# Patient Record
Sex: Female | Born: 2000 | Race: White | Hispanic: No | Marital: Single | Smoking: Former smoker
Health system: Southern US, Community
[De-identification: ages and names within clinical notes are randomized; demographics above are authoritative.]

## PROBLEM LIST (undated history)

## (undated) DIAGNOSIS — F419 Anxiety disorder, unspecified: Secondary | ICD-10-CM

## (undated) DIAGNOSIS — F909 Attention-deficit hyperactivity disorder, unspecified type: Secondary | ICD-10-CM

## (undated) DIAGNOSIS — F1310 Sedative, hypnotic or anxiolytic abuse, uncomplicated: Secondary | ICD-10-CM

## (undated) DIAGNOSIS — N1370 Vesicoureteral-reflux, unspecified: Secondary | ICD-10-CM

## (undated) HISTORY — DX: Vesicoureteral-reflux, unspecified: N13.70

## (undated) HISTORY — DX: Anxiety disorder, unspecified: F41.9

## (undated) HISTORY — DX: Attention-deficit hyperactivity disorder, unspecified type: F90.9

## (undated) HISTORY — PX: OTHER SURGICAL HISTORY: SHX169

## (undated) HISTORY — PX: SKIN GRAFT: SHX250

---
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
## 2018-02-11 ENCOUNTER — Ambulatory Visit: Payer: Self-pay | Admitting: Family Medicine

## 2018-03-08 ENCOUNTER — Other Ambulatory Visit: Payer: Self-pay

## 2018-03-08 ENCOUNTER — Encounter: Payer: Self-pay | Admitting: Family Medicine

## 2018-03-08 ENCOUNTER — Emergency Department
Admission: EM | Admit: 2018-03-08 | Discharge: 2018-03-08 | Disposition: A | Discharge status: Home / Self Care | Payer: Self-pay | Attending: Emergency Medicine | Admitting: Emergency Medicine

## 2018-03-08 DIAGNOSIS — F17200 Nicotine dependence, unspecified, uncomplicated: Secondary | ICD-10-CM | POA: Insufficient documentation

## 2018-03-08 DIAGNOSIS — F909 Attention-deficit hyperactivity disorder, unspecified type: Secondary | ICD-10-CM | POA: Insufficient documentation

## 2018-03-08 DIAGNOSIS — F1010 Alcohol abuse, uncomplicated: Secondary | ICD-10-CM | POA: Insufficient documentation

## 2018-03-08 DIAGNOSIS — F1310 Sedative, hypnotic or anxiolytic abuse, uncomplicated: Secondary | ICD-10-CM | POA: Insufficient documentation

## 2018-03-08 HISTORY — DX: Sedative, hypnotic or anxiolytic abuse, uncomplicated: F13.10

## 2018-03-08 LAB — POCT PREGNANCY, URINE: Preg Test, Ur: NEGATIVE

## 2018-03-08 LAB — URINE DRUG SCREEN, QUALITATIVE (ARMC ONLY)
Amphetamines, Ur Screen: NOT DETECTED
BARBITURATES, UR SCREEN: NOT DETECTED
Benzodiazepine, Ur Scrn: POSITIVE — AB
CANNABINOID 50 NG, UR ~~LOC~~: POSITIVE — AB
COCAINE METABOLITE, UR ~~LOC~~: NOT DETECTED
MDMA (Ecstasy)Ur Screen: NOT DETECTED
Methadone Scn, Ur: NOT DETECTED
Opiate, Ur Screen: NOT DETECTED
PHENCYCLIDINE (PCP) UR S: NOT DETECTED
TRICYCLIC, UR SCREEN: NOT DETECTED

## 2018-03-08 LAB — COMPREHENSIVE METABOLIC PANEL
ALT: 17 U/L (ref 0–44)
AST: 27 U/L (ref 15–41)
Albumin: 4.6 g/dL (ref 3.5–5.0)
Alkaline Phosphatase: 38 U/L (ref 38–126)
Anion gap: 8 (ref 5–15)
BILIRUBIN TOTAL: 0.4 mg/dL (ref 0.3–1.2)
BUN: 21 mg/dL — AB (ref 6–20)
CO2: 24 mmol/L (ref 22–32)
CREATININE: 0.78 mg/dL (ref 0.44–1.00)
Calcium: 9.4 mg/dL (ref 8.9–10.3)
Chloride: 104 mmol/L (ref 98–111)
GFR calc Af Amer: >60 mL/min (ref >60)
GLUCOSE: 108 mg/dL — AB (ref 70–99)
Potassium: 3.6 mmol/L (ref 3.5–5.1)
Sodium: 136 mmol/L (ref 135–145)
TOTAL PROTEIN: 7.9 g/dL (ref 6.5–8.1)

## 2018-03-08 LAB — CBC
HCT: 40.5 % (ref 36.0–46.0)
HEMOGLOBIN: 13.2 g/dL (ref 12.0–15.0)
MCH: 29.9 pg (ref 26.0–34.0)
MCHC: 32.6 g/dL (ref 30.0–36.0)
MCV: 91.6 fL (ref 80.0–100.0)
Platelets: 212 10*3/uL (ref 150–400)
RBC: 4.42 MIL/uL (ref 3.87–5.11)
RDW: 13.4 % (ref 11.5–15.5)
WBC: 10 10*3/uL (ref 4.0–10.5)
nRBC: 0 % (ref 0.0–0.2)

## 2018-03-08 LAB — ETHANOL: Alcohol, Ethyl (B): <10 mg/dL (ref <10)

## 2018-03-08 NOTE — ED Notes (Signed)
Pt states she wants help getting into a rehab program.  Pt reports she has been taking Xanax. Denies other drug use.  Admits to occasional alcohol use. Denies SI/HI and AVH. Denies pain.   Maintained on 15 minute checks and observation by security camera for safety.

## 2018-03-08 NOTE — ED Provider Notes (Signed)
[facility] Emergency Department Provider Note       Time seen: ----------------------------------------- 3:20 PM on 03/08/2018 -----------------------------------------   I have reviewed the triage vital signs and the nursing notes.  HISTORY   Chief Complaint Drug Problem   HPI Jamie Dodson is a 17 y.o. female with a history of anxiety who presents to the ED for benzodiazepine abuse.  Patient reports she binges on benzos, took a Xanax.  Last use was last night.  She was not trying to harm herself.  She admits to drinking sometimes.  She denies any medical complaints.  Past Medical History:  Diagnosis Date  . ADHD (attention deficit hyperactivity disorder)   . Ureteral reflux     Patient Active Problem List   Diagnosis Date Noted  . 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 Fever blister 06/20/2015  . Encounter for counseling regarding contraception 06/20/2015  . Need for HPV vaccine 06/20/2015  . Panic disorder with agoraphobia 06/20/2015    Past Surgical History:  Procedure Laterality Date  . kidney surgery    . SKIN GRAFT     Hand    Allergies Patient has no known allergies.  Social History Social History   Tobacco Use  . Smoking status: Current Some Day Smoker  . Smokeless tobacco: Never Used  Substance Use Topics  . Alcohol use: Yes    Alcohol/week: 0.0 standard drinks    Comment: reports drank 1/3 of 1/5Captain Morgan last night  . Drug use: Yes   Review of Systems Constitutional: Negative for fever. Cardiovascular: Negative for chest pain. Respiratory: Negative for shortness of breath. Gastrointestinal: Negative for abdominal pain, vomiting and diarrhea. Musculoskeletal: Negative for back pain. Skin: Negative for rash. Neurological: Negative for headaches, focal weakness or numbness. Psychiatric: Positive for substance abuse, negative for suicidal or homicidal ideation  All systems negative/normal/unremarkable  except as stated in the HPI  ____________________________________________   PHYSICAL EXAM:  VITAL SIGNS: ED Triage Vitals  Enc Vitals Group     BP 03/08/18 1430 118/80     Pulse Rate 03/08/18 1429 85     Resp 03/08/18 1429 18     Temp 03/08/18 1429 (!) 97.5 F (36.4 C)     Temp Source 03/08/18 1429 Oral     SpO2 03/08/18 1429 100 %     Weight 03/08/18 1430 115 lb (52.2 kg)     Height 03/08/18 1430 5\' 5"  (1.651 m)     Head Circumference --      Peak Flow --      Pain Score 03/08/18 1430 0     Pain Loc --      Pain Edu? --      Excl. in GC? --    Constitutional: Alert and oriented. Well appearing and in no distress. Eyes: Conjunctivae are normal. Normal extraocular movements. Cardiovascular: Normal rate, regular rhythm. No murmurs, rubs, or gallops. Respiratory: Normal respiratory effort without tachypnea nor retractions. Breath sounds are clear and equal bilaterally. No wheezes/rales/rhonchi. Gastrointestinal: Soft and nontender. Normal bowel sounds Musculoskeletal: Nontender with normal range of motion in extremities. No lower extremity tenderness nor edema. Neurologic:  Normal speech and language. No gross focal neurologic deficits are appreciated.  Skin:  Skin is warm, dry and intact. No rash noted. Psychiatric: Mood and affect are normal. Speech and behavior are normal.  ____________________________________________  ED COURSE:  As part of my medical decision making, I reviewed the following data within the electronic MEDICAL RECORD NUMBER History obtained from  family if available, nursing notes, old chart and ekg, as well as notes from prior ED visits. Patient presented for benzodiazepine abuse, we will assess with labs and discussed with TTS concerning outpatient detox   Procedures ____________________________________________   LABS (pertinent positives/negatives)  Labs Reviewed  COMPREHENSIVE METABOLIC PANEL - Abnormal; Notable for the following components:       Result Value   Glucose, Bld 108 (*)    BUN 21 (*)    All other components within normal limits  URINE DRUG SCREEN, QUALITATIVE ([facility] ONLY) - Abnormal; Notable for the following components:   Cannabinoid 50 Ng, Ur [place] POSITIVE (*)    Benzodiazepine, Ur Scrn POSITIVE (*)    All other components within normal limits  ETHANOL  CBC  POCT PREGNANCY, URINE  POC URINE PREG, ED   ___________________________________________  DIFFERENTIAL DIAGNOSIS   Substance use disorder, anxiety  FINAL ASSESSMENT AND PLAN  Benzodiazepine abuse   Plan: The patient had presented for detox referral. Patient's labs are unremarkable.  She appears medically cleared for outpatient detox referral.   Ulice DashJohnathan E Williams, MD   Note: This note was generated in part or whole with voice recognition software. Voice recognition is usually quite accurate but there are transcription errors that can and very often do occur. I apologize for any typographical errors that were not detected and corrected.     Emily FilbertWilliams, Jonathan E, MD 03/08/18 [phone]

## 2018-03-08 NOTE — ED Notes (Signed)
Pt discharged home (with father).  VS stable.  Discharge paperwork reviewed with patient. Pt signed for discharge. Pt denies SI/HI.

## 2018-03-08 NOTE — BH Assessment (Signed)
Assessment Note  Jamie Dodson is a 17 y.o. female. Presented to [facility] ED with history of Xannax abuse. Pt. Shares she took 2-2mg  Xannax bars, and became disoriented. Pt. Reports she binges on benzo's., particularly Xannax bars (2mg ) which she obtains without a prescription from friends or wherever she can obtain them. Pt. Reports she feels depressed and anxious because she has nothing to do. "When I wake up, I just look out the window, theirs nothing to do".  Pt. Reports she drinks alcohol, but only drinks a couple of shots. "I only drink a couple of shots of Rum at a time, I don't really drink". Pt. Denies any medical complaints. Patient denies and SI/HI. Patient denies any AV/H. Pt. Is currently unemployed, and recently lost her job a couple of months ago. Pt. Is currently not in school. Pt. Reports "I finished the 11th grade and am going back to get my GED". Pt. was accompanied to [facility] ED with both her father and mother. Spoke with father, after obtaining permission from pt. Father states "I have seen changes over the past few months with the friends she brings home". Both father and mother want daughter to go to substance abuse classes and get some help. Pt is alert to time, date, situation, and place.  Pt. Request information for outpatient classes for substance abuse treatment. This Clinical research associatewriter provided  pt.with information for [facility] and [facility] on when Pt. Can go to clinic for assessment and attend classes on Substance Abuse Disorder. Pt. Is ambulatory and will be discharged with both parents.  Diagnosis: Panic Disorder, Anxiety Disorder   Past Medical History:  Past Medical History:  Diagnosis Date  . ADHD (attention deficit hyperactivity disorder)   . Ureteral reflux     Past Surgical History:  Procedure Laterality Date  . kidney surgery    . SKIN GRAFT     Hand    Family History:  Family History  Problem Relation Age of Onset  . Diabetes Maternal Grandmother   .  Hypertension Maternal Grandmother     Social History:  reports that she has been smoking. She has never used smokeless tobacco. She reports current alcohol use. She reports current drug use.  Additional Social History:  Alcohol / Drug Use Pain Medications: SEE MAR Prescriptions: SEE MAR Over the Counter: SEE MAR History of alcohol / drug use?: Yes Longest period of sobriety (when/how long): Unknown Negative Consequences of Use: Personal relationships Withdrawal Symptoms: Agitation Substance #1 Name of Substance 1: Xanax 1 - Age of First Use: 16 1 - Amount (size/oz): 2 mg 1 - Frequency: unknown 1 - Duration: unknown 1 - Last Use / Amount: 03/07/2018 Substance #2 Name of Substance 2: Alocohol 2 - Age of First Use: 16 2 - Amount (size/oz): "couple od shots of Rum" 2 - Frequency: periodically 2 - Duration: unknown 2 - Last Use / Amount: unknown  CIWA: CIWA-Ar BP: 115/74 Pulse Rate: 92 COWS:    Allergies: No Known Allergies  Home Medications: (Not in a hospital admission)   OB/GYN Status:  Patient's last menstrual period was 02/22/2018.  General Assessment Data Location of Assessment: [facility] ED Is this a Tele or Face-to-Face Assessment?: Face-to-Face Is this an Initial Assessment or a Re-assessment for this encounter?: Initial Assessment Patient Accompanied by:: Parent Language Other than English: No Living Arrangements: Other (Comment) What gender do you identify as?: Female Marital status: Single Maiden name: NA Pregnancy Status: No Living Arrangements: Parent Can pt return to current  living arrangement?: Yes Admission Status: Voluntary Is patient capable of signing voluntary admission?: Yes Referral Source: Self/Family/Friend Insurance type: none  Medical Screening Exam [facility] Walk-in ONLY) Medical Exam completed: Yes  Crisis Care Plan Living Arrangements: Parent Legal Guardian: Father Name of Psychiatrist: NA Name of Therapist: NA  Education Status Is  patient currently in school?: No Is the patient employed, unemployed or receiving disability?: Employed  Risk to self with the past 6 months Suicidal Ideation: No Has patient been a risk to self within the past 6 months prior to admission? : No Suicidal Intent: No Has patient had any suicidal intent within the past 6 months prior to admission? : No Is patient at risk for suicide?: No Suicidal Plan?: No Has patient had any suicidal plan within the past 6 months prior to admission? : No Access to Means: No What has been your use of drugs/alcohol within the last 12 months?: Xanax 2mg  Bars Previous Attempts/Gestures: No How many times?: 0 Other Self Harm Risks: None Triggers for Past Attempts: None known Intentional Self Injurious Behavior: None Family Suicide History: No Recent stressful life event(s): Job Loss Persecutory voices/beliefs?: No Depression: Yes Depression Symptoms: Insomnia, Loss of interest in usual pleasures, Feeling worthless/self pity Substance abuse history and/or treatment for substance abuse?: No Suicide prevention information given to non-admitted patients: Yes  Risk to Others within the past 6 months Homicidal Ideation: No Does patient have any lifetime risk of violence toward others beyond the six months prior to admission? : No Thoughts of Harm to Others: No Current Homicidal Plan: No Access to Homicidal Means: No Identified Victim: NA History of harm to others?: No Assessment of Violence: None Noted Violent Behavior Description: None Does patient have access to weapons?: No Criminal Charges Pending?: No Does patient have a court date: No Is patient on probation?: No  Psychosis Hallucinations: None noted Delusions: None noted  Mental Status Report Appearance/Hygiene: Unable to Assess Eye Contact: Good Motor Activity: Freedom of movement Speech: Logical/coherent, Soft Level of Consciousness: Alert Mood: Depressed, Worthless, low  self-esteem Affect: Depressed, Sad Anxiety Level: Moderate Thought Processes: Coherent, Circumstantial Judgement: Unimpaired Orientation: Person, Place, Time, Situation Obsessive Compulsive Thoughts/Behaviors: None  Cognitive Functioning Concentration: Good Memory: Recent Intact Is patient IDD: No Insight: Good Impulse Control: Good Appetite: Good Have you had any weight changes? : No Change Sleep: Decreased Total Hours of Sleep: 5 Vegetative Symptoms: None  ADLScreening [facility] Assessment Services) Patient's cognitive ability adequate to safely complete daily activities?: Yes Patient able to express need for assistance with ADLs?: Yes Independently performs ADLs?: Yes (appropriate for developmental age)  Prior Inpatient Therapy Prior Inpatient Therapy: No Prior Therapy Dates: NA Prior Therapy Facilty/Provider(s): NA Reason for Treatment: NA  Prior Outpatient Therapy Prior Outpatient Therapy: No Does patient have an ACCT team?: No Does patient have Intensive In-House Services?  : No Does patient have [facility] services? : No Does patient have P4CC services?: No  ADL Screening (condition at time of admission) Patient's cognitive ability adequate to safely complete daily activities?: Yes Is the patient deaf or have difficulty hearing?: No Does the patient have difficulty seeing, even when wearing glasses/contacts?: No Does the patient have difficulty concentrating, remembering, or making decisions?: No Patient able to express need for assistance with ADLs?: Yes Does the patient have difficulty dressing or bathing?: No Independently performs ADLs?: Yes (appropriate for developmental age) Does the patient have difficulty walking or climbing stairs?: No Weakness of Legs: None Weakness of Arms/Hands: None  Home Assistive Devices/Equipment Home  Assistive Devices/Equipment: None  Therapy Consults (therapy consults require a physician order) PT Evaluation Needed: No OT  Evalulation Needed: No SLP Evaluation Needed: No Abuse/Neglect Assessment (Assessment to be complete while patient is alone) Abuse/Neglect Assessment Can Be Completed: Yes Physical Abuse: Denies Verbal Abuse: Denies Sexual Abuse: Denies Exploitation of patient/patient's resources: Denies Self-Neglect: Denies Values / Beliefs Cultural Requests During Hospitalization: None Spiritual Requests During Hospitalization: None Consults Spiritual Care Consult Needed: No Social Work Consult Needed: No Merchant navy officer (For Healthcare) Does Patient Have a Medical Advance Directive?: No Would patient like information on creating a medical advance directive?: No - Patient declined       Child/Adolescent Assessment Running Away Risk: Denies Bed-Wetting: Denies Destruction of Property: Denies Cruelty to Animals: Denies Stealing: Denies Rebellious/Defies Authority: Denies Satanic Involvement: Denies Archivist: Denies Problems at [employer]: Denies Gang Involvement: Denies  Disposition:  Disposition Initial Assessment Completed for this Encounter: Yes Patient referred to: Outpatient clinic referral([facility] and [employer] [facility] )  On Site Evaluation by:   Reviewed with Physician:    Shakur Lembo R, MA, LCAS, CCSI 03/08/2018 4:33 PM

## 2018-03-08 NOTE — ED Notes (Signed)
Asked parents to step out back to lobby to wait a few minutes while pt is assessed by RN and MD before allowing parents to speak with MD. Dad said he had some questions for MD when he is allowed to come back.

## 2018-03-08 NOTE — ED Triage Notes (Addendum)
Pt A&O, ambulatory. States "I have a problem with drugs so I need help" last use last night, took xanax. Not prescribed to her. Took 1 or 2 pills. Denies doing this often. Parents with pt in triage. Calm, cooperative. Denies SI or HI. Admits to drinking "sometimes"

## 2018-03-08 NOTE — ED Notes (Signed)
Pt's father at bedside.  Maintained on 15 minute checks and observation by security camera for safety.

## 2018-03-14 ENCOUNTER — Ambulatory Visit (INDEPENDENT_AMBULATORY_CARE_PROVIDER_SITE_OTHER): Payer: Self-pay | Admitting: Family Medicine

## 2018-03-14 ENCOUNTER — Encounter: Payer: Self-pay | Admitting: Family Medicine

## 2018-03-14 VITALS — BP 112/68 | HR 99 | Temp 98.1°F | Ht 64.0 in | Wt 122.0 lb

## 2018-03-14 DIAGNOSIS — F419 Anxiety disorder, unspecified: Secondary | ICD-10-CM

## 2018-03-14 DIAGNOSIS — F4001 Agoraphobia with panic disorder: Secondary | ICD-10-CM

## 2018-03-14 DIAGNOSIS — F1310 Sedative, hypnotic or anxiolytic abuse, uncomplicated: Secondary | ICD-10-CM

## 2018-03-14 NOTE — Progress Notes (Signed)
BP 112/68   Pulse 99   Temp 98.1 F (36.7 C)   Ht 5\' 4"  (1.626 m)   Wt 122 lb (55.3 kg)   LMP 02/22/2018   SpO2 98%   BMI 20.94 kg/m    Subjective:    Patient ID: Jamie Dodson, female    DOB: 2000/05/27, 17 y.o.   MRN: [id number]  HPI: Jamie Dodson is a 17 y.o. female  Chief Complaint  Patient presents with  . Anxiety    HPI  Patient is here for ER follow-up She went to the ER on December 21st It was the day before she came home from someone's house; woke up and went to the ER "This is not me" she said and went to the ER She has not been trafficked; she is safe Medicine was given to her by a friend Does use marijuana but not every day She first started using benzos about a year ago; illicitly obtained then too This is the second time she has used benzos She states that she only took one pill, not sure what drug or milligrams, just one pill Alcohol was not involved; no other drugs involved She does remember things, no amnesia; she does not think she was drugged She thought it would help her anxiety She used just one benzo, she does not know the strength or what kind it was She denies any sort of binge use; just the one night; we reviewed the ER wording and she denies binging; just had the one pill; though it would help her anxiety No withdrawals  Has number for [facility] and has not called b/c of busy holidays  Father drinks at night, he is a functioning alcoholic she thinks; paternal grandfather was an alcoholic; paternal uncle is an alcoholic; maternal grandmother has bad anxiety  GAD 7 : Generalized Anxiety Score 03/14/2018  Nervous, Anxious, on Edge 3  Control/stop worrying 3  Worry too much - different things 2  Trouble relaxing 1  Restless 1  Easily annoyed or irritable 3  Afraid - awful might happen 2  Total GAD 7 Score 15  Anxiety Difficulty Somewhat difficult   Anxiety; mostly when she wakes up, she'll be made and shakes a little bit if she doesn't have  food; she will worry about certain things, then gets panic attacks; mostly in the morning; has nightmares, lately; struggled with anxiety since high school, a few years; mother and grandmother have anxiety Feels down sometimes but not depressed; no SI/HI She has tried a few things for anxiety; lavender bath; classes for anxiety with ropes  Had alcohol intoxication five months ago; not an issue now; last drink was a glass of wine a few weeks ago  She has turned her phone off the last week overall; shew as more into and tried to calm down with the phone; she thinks that was some of the anxiety; she is not bullied on-line; no abuse, safe place to live  Sometimes drink a cup of coffee in the morning, but not often because of jitters; does like chocolate; just a little bit; does love iced tea  Not a great eater; lots of fast food Takes a while to lay down and get to sleep; will work on earlier bedtime She does exercise sometimes, walks sometimes  She has been on OCP for 1-2 months; she does not think this has made it worse  Depression screen [facility] 2/9 03/14/2018 11/29/2017 07/11/2016 08/22/2015 06/20/2015  Decreased Interest 0 0 0  0 0  Down, Depressed, Hopeless 1 0 1 0 0  PHQ - 2 Score 1 0 1 0 0  Altered sleeping 1 - 3 - -  Tired, decreased energy 1 - 2 - -  Change in appetite 0 - 1 - -  Feeling bad or failure about yourself  1 - 1 - -  Trouble concentrating 2 - 3 - -  Moving slowly or fidgety/restless 1 - 3 - -  Suicidal thoughts 0 - 0 - -  PHQ-9 Score 7 - 14 - -  Difficult doing work/chores Somewhat difficult - - - -  No SI/HI  Fall Risk  03/14/2018 11/29/2017 08/22/2015 06/20/2015  Falls in the past year? 0 No No No    Relevant past medical, surgical, family and social history reviewed Past Medical History:  Diagnosis Date  . ADHD (attention deficit hyperactivity disorder)   . 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   ER note 03/08/2018  . Ureteral reflux    Past Surgical History:    Procedure Laterality Date  . kidney surgery    . SKIN GRAFT     Hand   Family History  Problem Relation Age of Onset  . Diabetes Maternal Grandmother   . Hypertension Maternal Grandmother    Social History   Tobacco Use  . Smoking status: Current Some Day Smoker  . Smokeless tobacco: Never Used  Substance Use Topics  . Alcohol use: Yes    Alcohol/week: 0.0 standard drinks    Comment: reports drank 1/3 of 1/5[place] last night  . Drug use: Yes     [facility]  AUDIT-C Score  0      Interim medical history since last visit reviewed. Allergies and medications reviewed  Review of Systems Per HPI unless specifically indicated above     Objective:    BP 112/68   Pulse 99   Temp 98.1 F (36.7 C)   Ht 5\' 4"  (1.626 m)   Wt 122 lb (55.3 kg)   LMP 02/22/2018   SpO2 98%   BMI 20.94 kg/m   Wt Readings from Last 3 Encounters:  03/14/18 122 lb (55.3 kg) (40 %, Z= -0.25)*  03/08/18 115 lb (52.2 kg) (26 %, Z= -0.66)*  11/29/17 115 lb (52.2 kg) (27 %, Z= -0.62)*   * Growth percentiles are based on CDC (Girls, 2-20 Years) data.    Physical Exam Constitutional:      General: She is not in acute distress.    Appearance: She is well-developed.  Eyes:     General: No scleral icterus. Neck:     Thyroid: No thyromegaly.  Cardiovascular:     Rate and Rhythm: Normal rate.  Pulmonary:     Effort: Pulmonary effort is normal.  Abdominal:     General: There is no distension.  Skin:    Coloration: Skin is not pale.  Psychiatric:        Attention and Perception: Attention normal. She is attentive.        Mood and Affect: Mood and affect normal. Mood is not anxious.        Speech: Speech is not delayed.        Behavior: Behavior normal. Behavior is not slowed or hyperactive.        Thought Content: Thought content normal. Thought content does not include homicidal or suicidal ideation.        Cognition and Memory:  Cognition normal.  Judgment: Judgment normal.     Comments: Hair dyed blonde; neat trendy clothing, shirt off the shoulders; tattoo showing; smiling and pleasant, laughs occasionally but not inappropriate; good eye contact with examiner     Results for orders placed or performed during the hospital encounter of 03/08/18  Comprehensive metabolic panel  Result Value Ref Range   Sodium 136 135 - 145 mmol/L   Potassium 3.6 3.5 - 5.1 mmol/L   Chloride 104 98 - 111 mmol/L   CO2 24 22 - 32 mmol/L   Glucose, Bld 108 (H) 70 - 99 mg/dL   BUN 21 (H) 6 - 20 mg/dL   Creatinine, Ser 6.96 0.44 - 1.00 mg/dL   Calcium 9.4 8.9 - 29.5 mg/dL   Total Protein 7.9 6.5 - 8.1 g/dL   Albumin 4.6 3.5 - 5.0 g/dL   AST 27 15 - 41 U/L   ALT 17 0 - 44 U/L   Alkaline Phosphatase 38 38 - 126 U/L   Total Bilirubin 0.4 0.3 - 1.2 mg/dL   GFR calc non Af Amer >60 >60 mL/min   GFR calc Af Amer >60 >60 mL/min   Anion gap 8 5 - 15  Ethanol  Result Value Ref Range   Alcohol, Ethyl (B) <10 <10 mg/dL  cbc  Result Value Ref Range   WBC 10.0 4.0 - 10.5 K/uL   RBC 4.42 3.87 - 5.11 MIL/uL   Hemoglobin 13.2 12.0 - 15.0 g/dL   HCT 28.4 13.2 - 44.0 %   MCV 91.6 80.0 - 100.0 fL   MCH 29.9 26.0 - 34.0 pg   MCHC 32.6 30.0 - 36.0 g/dL   RDW 10.2 72.5 - 36.6 %   Platelets 212 150 - 400 K/uL   nRBC 0.0 0.0 - 0.2 %  Urine Drug Screen, Qualitative  Result Value Ref Range   Tricyclic, Ur Screen NONE DETECTED NONE DETECTED   Amphetamines, Ur Screen NONE DETECTED NONE DETECTED   MDMA (Ecstasy)Ur Screen NONE DETECTED NONE DETECTED   Cocaine Metabolite,Ur [place] NONE DETECTED NONE DETECTED   Opiate, Ur Screen NONE DETECTED NONE DETECTED   Phencyclidine (PCP) Ur S NONE DETECTED NONE DETECTED   Cannabinoid 50 Ng, Ur [place] POSITIVE (A) NONE DETECTED   Barbiturates, Ur Screen NONE DETECTED NONE DETECTED   Benzodiazepine, Ur Scrn POSITIVE (A) NONE DETECTED   Methadone Scn, Ur NONE DETECTED NONE DETECTED  Pregnancy, urine POC    Result Value Ref Range   Preg Test, Ur NEGATIVE NEGATIVE      Assessment & Plan:   Problem List Items Addressed This Visit      Other   Panic disorder with agoraphobia    Urged patient to follow through with the referral recommendation to [facility]; she has the number and says she just hasn't called because of the busy holidays; I asked her to call today; number given again and she states she has the paperwork that she was given in the ER      Benzodiazepine abuse (HCC)    Per ER note Mar 08, 2018; patient denies bingeing on benzos; reports just two episodes of use, and just one pill this most recent time; no other drugs involved per patient, though she does admit to using marijuana when I shared that her UDS was positive for same; NO benzos from this office; she verfies that she is safe; praised her for going to get help in the ER last week and urged her again to follow through with their recommendation to  contact [facility]       Other Visit Diagnoses    Anxiety    -  Primary   encouraged ptto follow through witih [facility] eval; see AVS, reviewed things that may help her deal with anxiety; NO benzos from this office; limit caffeine,chocolat      Follow up plan: No follow-ups on file.  An after-visit summary was printed and given to the patient at check-out.  Please see the patient instructions which may contain other information and recommendations beyond what is mentioned above in the assessment and plan.  Face-to-face time with patient was more than 25 minutes, >50% time spent counseling and coordination of care

## 2018-03-14 NOTE — Assessment & Plan Note (Signed)
Urged patient to follow through with the referral recommendation to [facility]; she has the number and says she just hasn't called because of the busy holidays; I asked her to call today; number given again and she states she has the paperwork that she was given in the ER

## 2018-03-14 NOTE — Assessment & Plan Note (Addendum)
Per ER note Mar 08, 2018; patient denies bingeing on benzos; reports just two episodes of use, and just one pill this most recent time; no other drugs involved per patient, though she does admit to using marijuana when I shared that her UDS was positive for same; NO benzos from this office; she verfies that she is safe; praised her for going to get help in the ER last week and urged her again to follow through with their recommendation to contact [facility]

## 2018-03-14 NOTE — Patient Instructions (Addendum)
Please do contact [facility] as soon as possible Stop drinking sweet tea and eating chocolate because they can cause anxiety (cut back and taper off if drinking / eating a lot)    12 Ways to Curb Anxiety  ?Anxiety is normal human sensation. It is what helped our ancestors survive the pitfalls of the wilderness. Anxiety is defined as experiencing worry or nervousness about an imminent event or something with an uncertain outcome. It is a feeling experienced by most people at some point in their lives. Anxiety can be triggered by a very personal issue, such as the illness of a loved one, or an event of global proportions, such as a refugee crisis. Some of the symptoms of anxiety are:  Feeling restless.  Having a feeling of impending danger.  Increased heart rate.  Rapid breathing. Sweating.  Shaking.  Weakness or feeling tired.  Difficulty concentrating on anything except the current worry.  Insomnia.  Stomach or bowel problems. What can we do about anxiety we may be feeling? There are many techniques to help manage stress and relax. Here are 12 ways you can reduce your anxiety almost immediately: 1. Turn off the constant feed of information. Take a social media sabbatical. Studies have shown that social media directly contributes to social anxiety.  2. Monitor your television viewing habits. Are you watching shows that are also contributing to your anxiety, such as 24-hour news stations? Try watching something else, or better yet, nothing at all. Instead, listen to music, read an inspirational book or practice a hobby. 3. Eat nutritious meals. Also, don't skip meals and keep healthful snacks on hand. Hunger and poor diet contributes to feeling anxious. 4. Sleep. Sleeping on a regular schedule for at least seven to eight hours a night will do wonders for your outlook when you are awake. 5. Exercise. Regular exercise will help rid your body of that anxious energy and help you get more restful  sleep. 6. Try deep (diaphragmatic) breathing. Inhale slowly through your nose for five seconds and exhale through your mouth. 7. Practice acceptance and gratitude. When anxiety hits, accept that there are things out of your control that shouldn't be of immediate concern.  8. Seek out humor. When anxiety strikes, watch a funny video, read jokes or call a friend who makes you laugh. Laughter is healing for our bodies and releases endorphins that are calming. 9. Stay positive. Take the effort to replace negative thoughts with positive ones. Try to see a stressful situation in a positive light. Try to come up with solutions rather than dwelling on the problem. 10. Figure out what triggers your anxiety. Keep a journal and make note of anxious moments and the events surrounding them. This will help you identify triggers you can avoid or even eliminate. 11. Talk to someone. Let a trusted friend, family member or even trained professional know that you are feeling overwhelmed and anxious. Verbalize what you are feeling and why.  12. Volunteer. If your anxiety is triggered by a crisis on a large scale, become an advocate and work to resolve the problem that is causing you unease. Anxiety is often unwelcome and can become overwhelming. If not kept in check, it can become a disorder that could require medical treatment. However, if you take the time to care for yourself and avoid the triggers that make you anxious, you will be able to find moments of relaxation and clarity that make your life much more enjoyable.  Here are some resources to  help you if you feel you are in a mental health crisis:  National Suicide Prevention Lifeline - Call 220-673-38001-510 568 7996  for help - Website with more resources: ARanked.fihttps://suicidepreventionlifeline.org/  [facility] - Call 2523202668571-294-8223 for help. - Mobile Crisis Program available 24 hours a day, 365 days a year. - Available for anyone of any age  in [place] counties.  [facility] - Address: [address], [place] - Telephone: [phone]  - Hours of Operation: Sunday - Saturday - 8:00 a.m. - 8:00 p.m. - Medicaid, Medicare (Government Issued Only), BCBS, and [employer] - Crisis Management, Outpatient Individual & Group Therapy, Psychiatrists on-site to provide medication management, In-Home Psychiatric Care, and Peer Support Care.  [facility] - Call [phone] for help. - Mobile Crisis Program available 24 hours a day, 365 days a year. - Available for anyone of any age in [place]

## 2019-03-20 NOTE — L&D Delivery Note (Signed)
Delivery Note  Date of delivery: 12/06/2019 Estimated Date of Delivery: 12/13/19 Patient's last menstrual period was 03/08/2019 (exact date). EGA: [redacted]w[redacted]d  Delivery Note At 1:06 AM a viable female was delivered via Vaginal, Spontaneous (Presentation: OA).  APGAR: 8, 9; weight pending. Placenta status: Spontaneous, Intact.  Cord: 3 vessels with the following complications: Short.    First Stage: Labor onset: unknown Augmentation : pitocin Analgesia /Anesthesia intrapartum: epidural SROM at 0234  Jamie Dodson presented to L&D with PROM. She was augmented with pitocin. Epidural placed.   Second Stage: Complete dilation at 0021 Onset of pushing at 0030 FHR second stage Cat II Delivery at 0106 on 12/06/2019  She progressed to complete and had a spontaneous vaginal birth of a live female over an intact perineum. The fetal head was delivered in OA position with restitution to LOA. No nuchal cord. Anterior then posterior shoulders delivered spontaneously. Baby placed on mom's abdomen and attended to by transition RN. Cord clamped and cut when pulseless by FOB. Cord blood obtained for newborn labs.  Third Stage: Placenta delivered  intact with 3VC at 0111 Placenta disposition: pathology Uterine tone firm / bleeding min IV pitocin given for hemorrhage prophylaxis  Anesthesia: Epidural Episiotomy: None Lacerations: 1st degree;Vaginal Suture Repair: 3.0 vicryl Est. Blood Loss (mL):  100  Complications: short cord, MSAF,   Mom to postpartum.  Baby to Couplet care / Skin to Skin.  Newborn: Birth Weight: pending  Apgar Scores: 8, 9 Feeding planned: breast   Cyril Mourning, CNM 12/06/2019 1:27 AM

## 2019-04-27 ENCOUNTER — Other Ambulatory Visit: Payer: Self-pay

## 2019-04-27 ENCOUNTER — Ambulatory Visit (LOCAL_COMMUNITY_HEALTH_CENTER): Payer: Self-pay

## 2019-04-27 VITALS — BP 117/78 | Ht 63.0 in | Wt 123.0 lb

## 2019-04-27 DIAGNOSIS — Z3201 Encounter for pregnancy test, result positive: Secondary | ICD-10-CM

## 2019-04-27 LAB — PREGNANCY, URINE: Preg Test, Ur: POSITIVE — AB

## 2019-04-27 MED ORDER — PRENATAL VITAMINS 28-0.8 MG PO TABS: 28.0000 mg | ORAL_TABLET | Freq: Every day | ORAL | 0 refills | Status: AC

## 2019-04-27 NOTE — Progress Notes (Signed)
Patient unsure where she will receive PNC. Co to apply for medicaid; verbalized understanding. Richmond Campbell, RN

## 2019-05-23 IMAGING — CR DG CHEST 2V
1 series · 2 of 2 positions shown · non-contrast
Comparison: [DATE]

CLINICAL DATA: Palpitations.

EXAM:
CHEST - 2 VIEW

[Series 1: dg chest 2 view · 0.14mm/px · 2 of 2 slices shown]
[im 1/2]
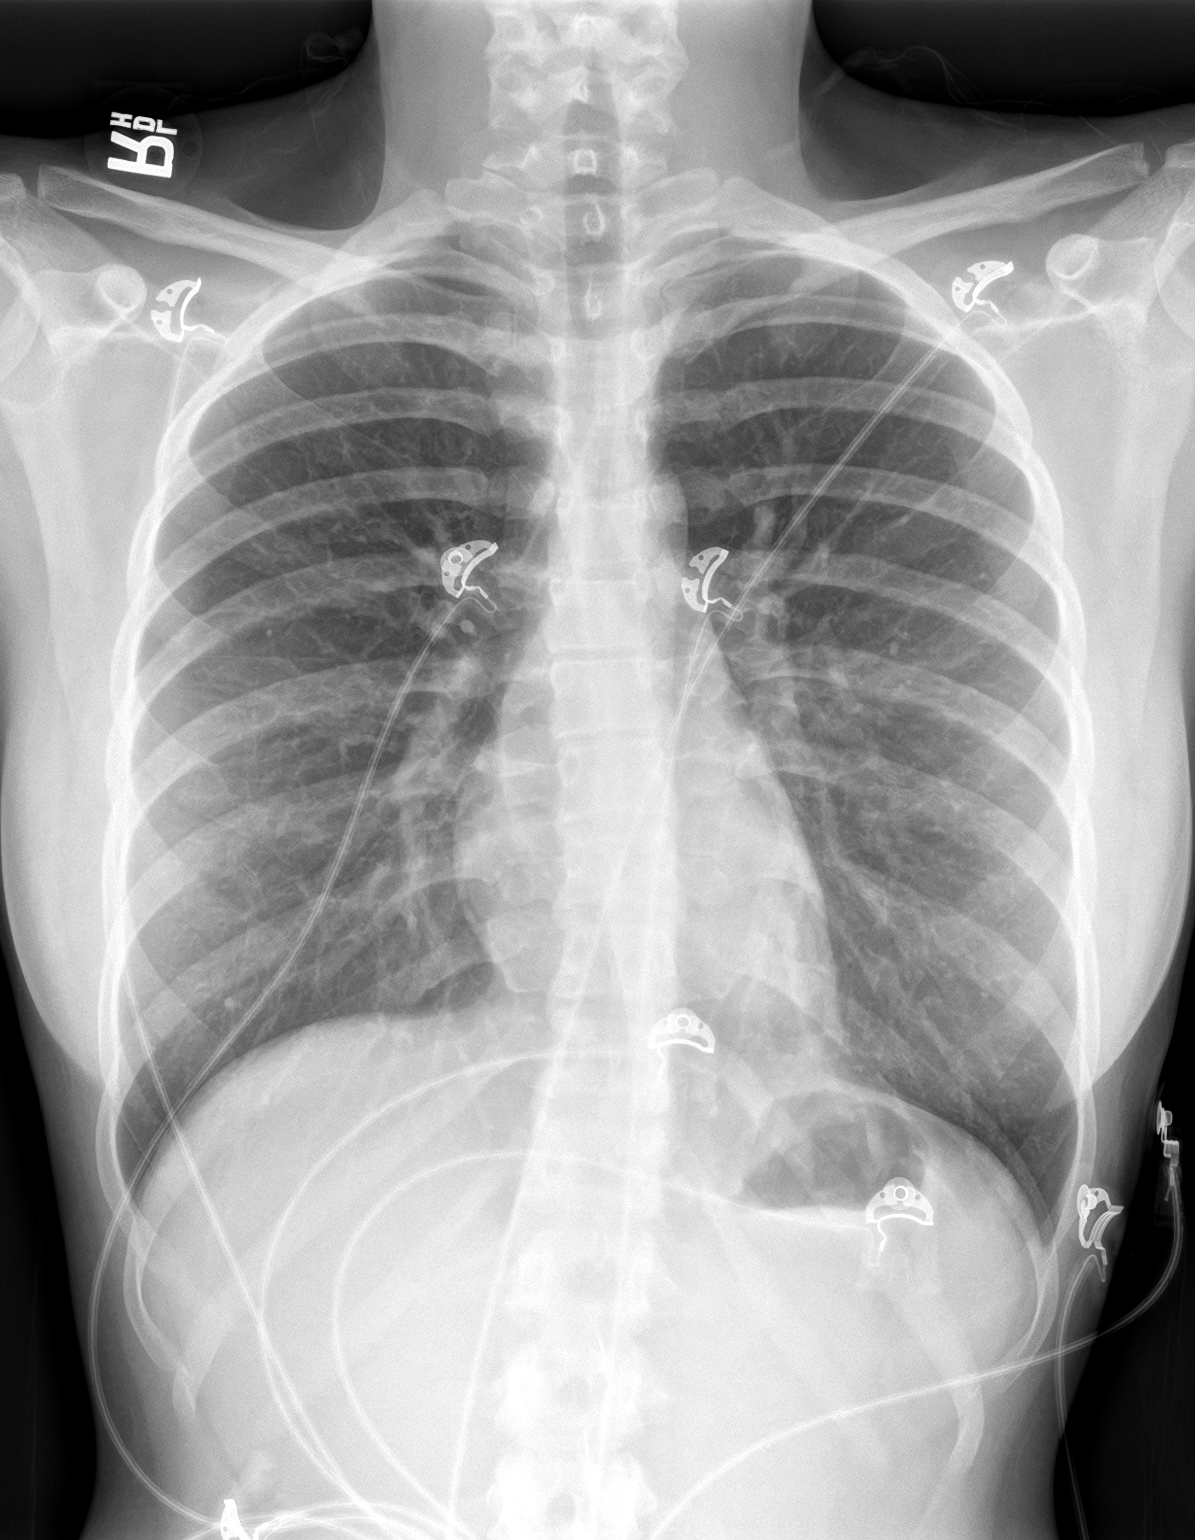
[im 2/2]
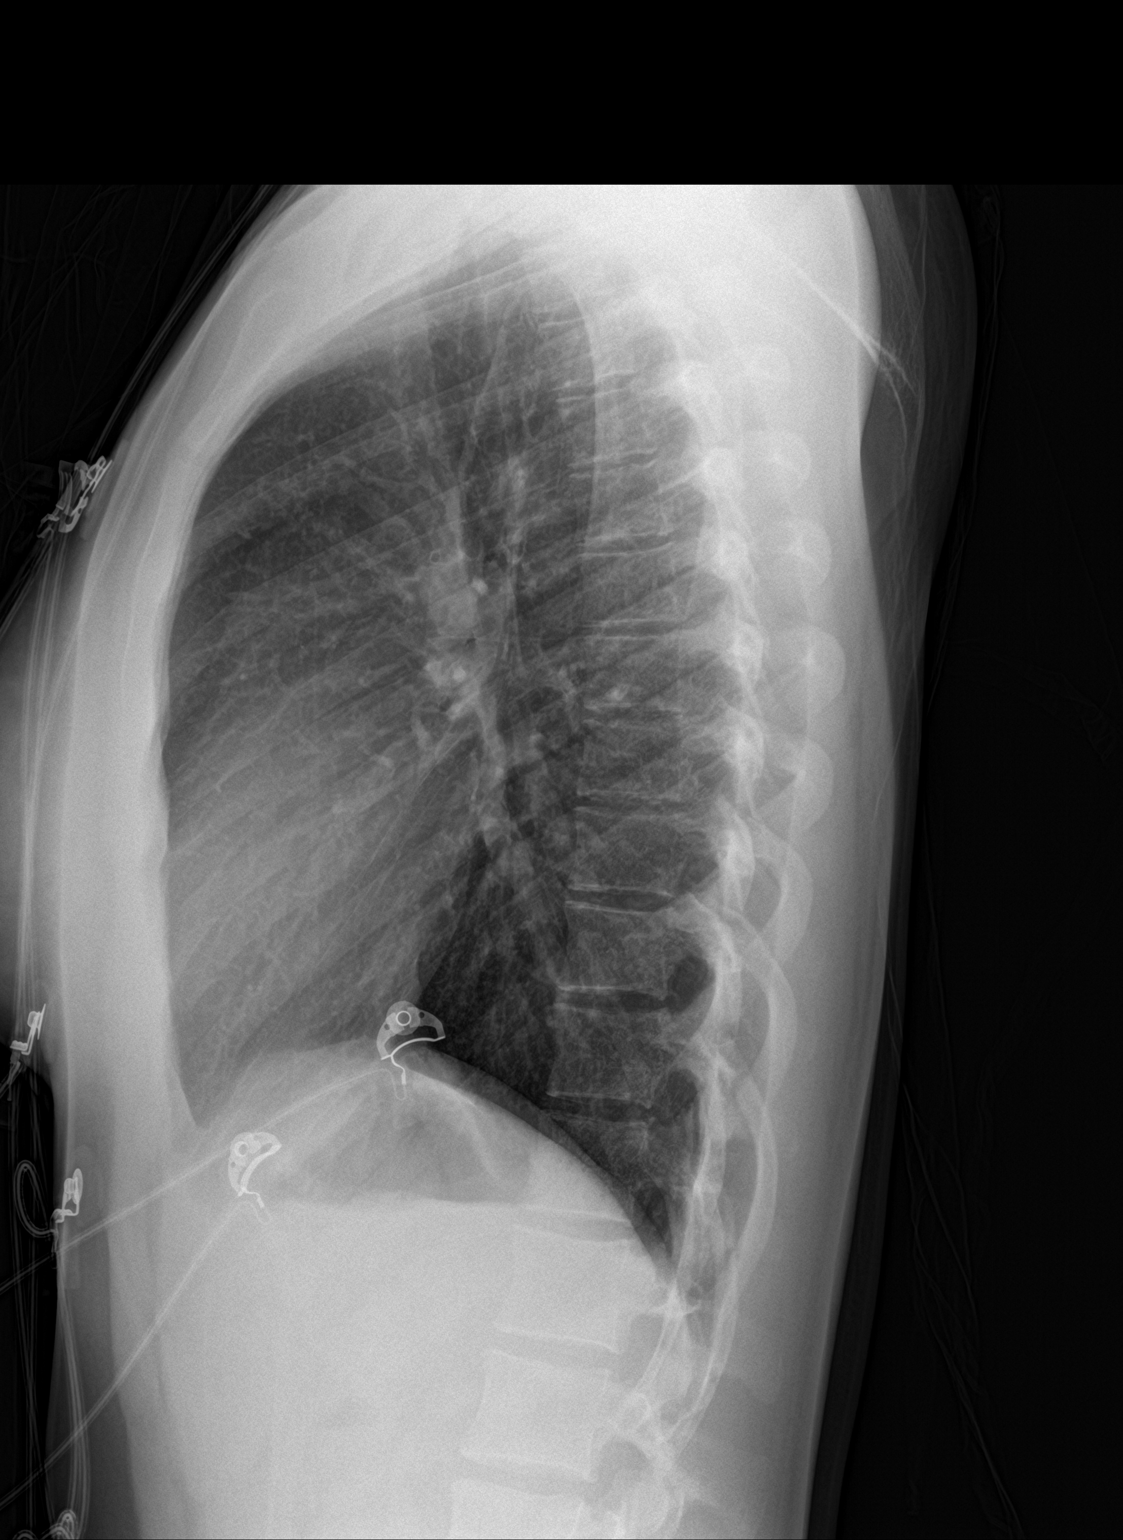

[2 of 2 positions shown; findings below may reference images not displayed]

FINDINGS: The heart size and mediastinal contours are within normal limits.
Both lungs are clear. The visualized skeletal structures are
unremarkable.
IMPRESSION: No active cardiopulmonary disease.

## 2019-05-25 NOTE — Progress Notes (Signed)
Record abstracted per 05/23/19 phone interview with Hal Hope, RN; Sharlette Dense, RN

## 2019-05-26 ENCOUNTER — Other Ambulatory Visit: Payer: Self-pay

## 2019-05-26 ENCOUNTER — Encounter: Payer: Self-pay | Admitting: Family Medicine

## 2019-05-26 ENCOUNTER — Ambulatory Visit: Payer: Medicaid Other | Admitting: Family Medicine

## 2019-05-26 VITALS — BP 117/73 | HR 100 | Temp 97.2°F | Wt 127.0 lb

## 2019-05-26 DIAGNOSIS — Z3400 Encounter for supervision of normal first pregnancy, unspecified trimester: Secondary | ICD-10-CM

## 2019-05-26 DIAGNOSIS — F1310 Sedative, hypnotic or anxiolytic abuse, uncomplicated: Secondary | ICD-10-CM

## 2019-05-26 DIAGNOSIS — F1290 Cannabis use, unspecified, uncomplicated: Secondary | ICD-10-CM

## 2019-05-26 LAB — OB RESULTS CONSOLE HIV ANTIBODY (ROUTINE TESTING): HIV: NONREACTIVE

## 2019-05-26 LAB — OB RESULTS CONSOLE VARICELLA ZOSTER ANTIBODY, IGG: Varicella: IMMUNE

## 2019-05-26 NOTE — Progress Notes (Signed)
[facility] HEALTH DEPT [facility] [address] [place] [address] [place] [address] [phone]  INITIAL PRENATAL VISIT NOTE  Subjective:  Jamie Dodson is a 19 y.o. G1P0 at [redacted]w[redacted]d being seen today to start prenatal care at the [facility] Department.  She is currently monitored for the following issues for this low-risk pregnancy and has Panic disorder with agoraphobia; Fever blister; Benzodiazepine abuse (HCC); Supervision of normal first pregnancy, antepartum; and Marijuana use on their problem list.  Pt is here for initial OB visit. She lives with her boyfriend and is not currently working. Her boyfriend works for [employer]. Physically she is feeling ok, states nausea has improved in the past few days. She denies chronic medical issues, takes no medications daily. She last had alcohol before finding out she was pregnant, around [redacted] wks gestation had a couple beers. She smoked tobacco prior to learning she was pregnant, has since quit. She uses CBD gummies and hemp lotion - currently uses 4-5 gummies/day, goal is to cut back 2-3 gummies/day. Currently using 4-5 gummies/day. PHQ-9 score is 5, declines beh health referral.    Contractions: Not present. Vag. Bleeding: None.  Movement: Absent. Denies leaking of fluid.   Indications for ASA therapy (per uptodate) One of the following: Previous pregnancy with preeclampsia, especially early onset and with an adverse outcome No Multifetal gestation No Chronic hypertension No Type 1 or 2 diabetes mellitus No Chronic kidney disease No Autoimmune disease (antiphospholipid syndrome, systemic lupus erythematosus) No  Two or more of the following: Nulliparity Yes Obesity (body mass index >30 kg/m2) No Family history of preeclampsia in mother or sister No Age ?35 years No Sociodemographic characteristics (African American race, low socioeconomic level) Yes Personal risk factors (eg, previous  pregnancy with low birth weight or small for gestational age infant, previous adverse pregnancy outcome [eg, stillbirth], interval >10 years between pregnancies) No   The following portions of the patient's history were reviewed and updated as appropriate: allergies, current medications, past family history, past medical history, past social history, past surgical history and problem list. Problem list updated.  Objective:   Vitals:   05/26/19 1351  BP: 117/73  Pulse: 100  Temp: (!) 97.2 F (36.2 C)  Weight: 127 lb (57.6 kg)    Fetal Status: Fetal Heart Rate (bpm): 150 Fundal Height: 11 cm Movement: Absent  Presentation: Undeterminable   Physical Exam Vitals and nursing note reviewed.  Constitutional:      General: She is not in acute distress.    Appearance: Normal appearance. She is well-developed.  HENT:     Head: Normocephalic and atraumatic.     Right Ear: External ear normal.     Left Ear: External ear normal.     Nose: Nose normal. No congestion or rhinorrhea.     Mouth/Throat:     Lips: Pink.     Mouth: Mucous membranes are moist.     Dentition: Normal dentition. No dental caries.     Pharynx: Oropharynx is clear. Uvula midline.  Eyes:     General: No scleral icterus.    Conjunctiva/sclera: Conjunctivae normal.  Neck:     Thyroid: No thyroid mass or thyromegaly.  Cardiovascular:     Rate and Rhythm: Normal rate.     Pulses: Normal pulses.     Comments: Extremities are warm and well perfused Pulmonary:     Effort: Pulmonary effort is normal.     Breath sounds: Normal breath sounds.  Chest:     Breasts: Breasts are symmetrical.        Right: Normal. No mass, nipple discharge or skin change.        Left: Normal. No mass, nipple discharge or skin change.  Abdominal:     General: Abdomen is flat.     Palpations: Abdomen is soft.     Tenderness: There is no abdominal tenderness.     Comments: Gravid   Genitourinary:    General: Normal vulva.     Exam  position: Lithotomy position.     Pubic Area: No rash.      Labia:        Right: No rash.        Left: No rash.      Vagina: Vaginal discharge (white, moderate amt) present.     Cervix: No cervical motion tenderness or friability.     Uterus: Normal. Enlarged (Gravid 11 wk size ). Not tender.      Adnexa: Right adnexa normal and left adnexa normal.     Rectum: Normal. No external hemorrhoid.  Musculoskeletal:     Right lower leg: No edema.     Left lower leg: No edema.  Lymphadenopathy:     Upper Body:     Right upper body: No axillary adenopathy.     Left upper body: No axillary adenopathy.  Skin:    General: Skin is warm.     Capillary Refill: Capillary refill takes less than 2 seconds.       Neurological:     Mental Status: She is alert.     Assessment and Plan:  Pregnancy: G1P0 at [redacted]w[redacted]d    1. Supervision of normal first pregnancy, antepartum -Initial prenatal visit today.  -Pt elects 1st trimester genetic screening, referral placed today. -Pap will be due age 28 *Addendum: Pt qualifies for aspirin daily from 12-[redacted] wks gestation d/t 1st pregnancy and low SES. I unfortunately forgot to mention this during visit and was unable to reach pt by phone. Left message to call clinic for this info.  - WET PREP FOR TRICH, YEAST, CLUE - Hemoglobin, venipuncture - Urinalysis (Urine Dip) - Prenatal profile without Varicella/Rubella (712458) - Varicella zoster antibody, IgG - Urine Culture - Chlamydia/GC NAA, Confirmation - Lead, blood (adult age 60 yrs or greater)  2. Marijuana use -Pt currently using 4-5 CBD gummies daily. Encouraged complete cessation of all marijuana products and discussed risks of use in pregnancy. Pt's goal is to reduce to 2-3 gummies per day. Offered beh health referral, she declines. - [id number] Drug Screen - Hepatitis C Antibody  3. Benzodiazepine abuse ([place]) -Last use 1 yr ago per pt. She agrees to urine drug screen today.      Discussed overview  of care and coordination with inpatient delivery practices including [facility], [facility], [facility] and [facility].     Preterm labor symptoms and general obstetric precautions including but not limited to vaginal bleeding, contractions, leaking of fluid and fetal movement were reviewed in detail with the patient.  Please refer to After Visit Summary for other counseling recommendations.   Return in about 4 weeks (around 06/23/2019).  Future Appointments  Date Time Provider [place]  06/23/2019  2:00 PM [facility] PROVIDER [facility] None    Kandee Keen, PA-C

## 2019-05-26 NOTE — Progress Notes (Addendum)
Here today for 11.2 week MH IP. Taking PNV QD. Denies ED/hospital visits since +PT. Declines Flu vaccine. Tawny Hopping, RN

## 2019-05-26 NOTE — Progress Notes (Signed)
[place]et [place] results reviewed. Per standing orders no treatment indicated. Ridwan Bondy, RN  

## 2019-05-27 ENCOUNTER — Other Ambulatory Visit: Payer: Self-pay | Admitting: Family Medicine

## 2019-05-27 ENCOUNTER — Telehealth: Payer: Self-pay

## 2019-05-27 DIAGNOSIS — Z369 Encounter for antenatal screening, unspecified: Secondary | ICD-10-CM

## 2019-05-27 DIAGNOSIS — O26891 Other specified pregnancy related conditions, first trimester: Secondary | ICD-10-CM | POA: Insufficient documentation

## 2019-05-27 DIAGNOSIS — Z6791 Unspecified blood type, Rh negative: Secondary | ICD-10-CM | POA: Insufficient documentation

## 2019-05-27 LAB — CBC/D/PLT+RPR+RH+ABO+AB SCR
Antibody Screen: NEGATIVE
Basophils Absolute: 0 10*3/uL (ref 0.0–0.2)
Basos: 0 %
EOS (ABSOLUTE): 0.1 10*3/uL (ref 0.0–0.4)
Eos: 1 %
Hematocrit: 33.7 % — ABNORMAL LOW (ref 34.0–46.6)
Hemoglobin: 11.5 g/dL (ref 11.1–15.9)
Hepatitis B Surface Ag: NEGATIVE
Immature Grans (Abs): 0 10*3/uL (ref 0.0–0.1)
Immature Granulocytes: 0 %
Lymphocytes Absolute: 2.1 10*3/uL (ref 0.7–3.1)
Lymphs: 21 %
MCH: 29 pg (ref 26.6–33.0)
MCHC: 34.1 g/dL (ref 31.5–35.7)
MCV: 85 fL (ref 79–97)
Monocytes Absolute: 0.7 10*3/uL (ref 0.1–0.9)
Monocytes: 7 %
Neutrophils Absolute: 7.2 10*3/uL — ABNORMAL HIGH (ref 1.4–7.0)
Neutrophils: 71 %
Platelets: 222 10*3/uL (ref 150–450)
RBC: 3.97 x10E6/uL (ref 3.77–5.28)
RDW: 13 % (ref 11.7–15.4)
RPR Ser Ql: NONREACTIVE
Rh Factor: NEGATIVE
WBC: 10.1 10*3/uL (ref 3.4–10.8)

## 2019-05-27 LAB — WET PREP FOR TRICH, YEAST, CLUE
Trichomonas Exam: NEGATIVE
Yeast Exam: NEGATIVE

## 2019-05-27 LAB — URINALYSIS
Bilirubin, UA: NEGATIVE
Glucose, UA: NEGATIVE
Ketones, UA: NEGATIVE
Leukocytes,UA: NEGATIVE
Nitrite, UA: NEGATIVE
Protein,UA: NEGATIVE
RBC, UA: NEGATIVE
Specific Gravity, UA: 1.03 (ref 1.005–1.030)
Urobilinogen, Ur: 0.2 mg/dL (ref 0.2–1.0)
pH, UA: 6 (ref 5.0–7.5)

## 2019-05-27 LAB — HEPATITIS C ANTIBODY: Hep C Virus Ab: <0.1 s/co ratio (ref 0.0–0.9)

## 2019-05-27 LAB — LEAD, BLOOD (ADULT >= 16 YRS): Lead-Whole Blood: <1 ug/dL (ref 0–4)

## 2019-05-27 LAB — VARICELLA ZOSTER ANTIBODY, IGG: Varicella zoster IgG: 2509 index (ref >165)

## 2019-05-27 LAB — HEMOGLOBIN, FINGERSTICK: Hemoglobin: 11.8 g/dL (ref 11.1–15.9)

## 2019-05-27 NOTE — Telephone Encounter (Signed)
Phone call to patient to inform of scheduled DP GC and [place] appt. No answer, LMTC. Tawny Hopping, RN

## 2019-05-28 LAB — CHLAMYDIA/GC NAA, CONFIRMATION
Chlamydia trachomatis, NAA: NEGATIVE
Neisseria gonorrhoeae, NAA: NEGATIVE

## 2019-05-28 LAB — URINE CULTURE: Organism ID, Bacteria: NO GROWTH

## 2019-05-29 LAB — CANNABINOID CONFIRMATION, UR
CANNABINOIDS: POSITIVE — AB
Carboxy THC GC/MS Conf: >300 ng/mL

## 2019-05-29 LAB — 789231 7+OXYCODONE-BUND
Amphetamines, Urine: NEGATIVE ng/mL
BENZODIAZ UR QL: NEGATIVE ng/mL
Barbiturate screen, urine: NEGATIVE ng/mL
Cocaine (Metab.): NEGATIVE ng/mL
OPIATE SCREEN URINE: NEGATIVE ng/mL
Oxycodone/Oxymorphone, Urine: NEGATIVE ng/mL
PCP Quant, Ur: NEGATIVE ng/mL

## 2019-06-08 ENCOUNTER — Other Ambulatory Visit: Payer: Self-pay

## 2019-06-08 ENCOUNTER — Ambulatory Visit (HOSPITAL_BASED_OUTPATIENT_CLINIC_OR_DEPARTMENT_OTHER)
Admission: RE | Admit: 2019-06-08 | Discharge: 2019-06-08 | Disposition: A | Discharge status: Home / Self Care | Payer: Medicaid Other | Source: Ambulatory Visit | Attending: Obstetrics and Gynecology | Admitting: Obstetrics and Gynecology

## 2019-06-08 ENCOUNTER — Other Ambulatory Visit: Payer: Self-pay | Admitting: Obstetrics and Gynecology

## 2019-06-08 ENCOUNTER — Ambulatory Visit
Admission: RE | Admit: 2019-06-08 | Discharge: 2019-06-08 | Disposition: A | Discharge status: Home / Self Care | Payer: Medicaid Other | Source: Ambulatory Visit | Attending: Family Medicine | Admitting: Family Medicine

## 2019-06-08 VITALS — BP 106/72 | HR 90 | Temp 98.1°F | Wt 128.5 lb

## 2019-06-08 DIAGNOSIS — Z3689 Encounter for other specified antenatal screening: Secondary | ICD-10-CM | POA: Insufficient documentation

## 2019-06-08 DIAGNOSIS — Z3A13 13 weeks gestation of pregnancy: Secondary | ICD-10-CM | POA: Diagnosis not present

## 2019-06-08 DIAGNOSIS — Z36 Encounter for antenatal screening for chromosomal anomalies: Secondary | ICD-10-CM | POA: Diagnosis not present

## 2019-06-08 DIAGNOSIS — Z6791 Unspecified blood type, Rh negative: Secondary | ICD-10-CM

## 2019-06-08 DIAGNOSIS — Z369 Encounter for antenatal screening, unspecified: Secondary | ICD-10-CM

## 2019-06-08 NOTE — Progress Notes (Signed)
Referring physician:  [facility] Length of consultation:  30 minutes  Jamie Dodson  was referred to [place] for genetic counseling to review prenatal screening and testing options.  This note summarizes the information we discussed.    We offered the following routine screening tests for this pregnancy:  The most accurate screening option for chromosome conditions is cell free fetal DNA testing.  Though this is typically reserved for pregnancies at increased risk for aneuploidy, it is currently being made available and many insurance companies are adding coverage for this testing in low risk patients during Jamie Dodson.  This test utilizes a maternal blood sample and DNA sequencing technology to isolate circulating cell free fetal DNA from maternal plasma.  The fetal DNA can then be analyzed for DNA sequences that are derived from the three most common chromosomes involved in aneuploidy, chromosomes 13, 18, and 21.  If the overall amount of DNA is greater than the expected level for any of these chromosomes, aneuploidy is suspected.  The detection rates are >99% for Down syndrome, >98% for trisomy 18 and >91% for Trisomy 13.  While we do not consider it a replacement for invasive testing and karyotype analysis, a negative result from this testing would be reassuring, though not a guarantee of a normal chromosome complement for the baby.  An abnormal result may be suggestive of an abnormal chromosome complement, though we would still recommend CVS or amniocentesis to confirm any findings from this testing.  First trimester screening, which includes nuchal translucency ultrasound screen and first trimester maternal serum marker screening, is the test that has most recently been available for low risk patients.  The nuchal translucency has approximately an 80% detection rate for Down syndrome and can be positive for other chromosome abnormalities as well as  congenital heart defects.  When combined with a maternal serum marker screening, the detection rate is up to 90% for Down syndrome and up to 97% for trisomy 18.   Given current recommendations during COVID, we are offering only the biochemical testing portion of this testing (without the ultrasound and NT portion), which has a much lower detection rate.  Maternal serum marker screening, or "quad" screen, is a blood test that measures pregnancy proteins, can provide risk assessments for Down syndrome, trisomy 18, and open neural tube defects (spina bifida, anencephaly). Because it does not directly examine the fetus, it cannot positively diagnose or rule out these problems. This is a second trimester option which could be offered along with the anatomy ultrasound. It can detect approximately 75% of babies with Down syndrome, 80% of babies with open spina bifida and 70% of babies with trisomy 69.  Targeted ultrasound uses high frequency sound waves to create an image of the developing fetus.  An ultrasound is often recommended as a routine means of evaluating the pregnancy.  It is also used to screen for fetal anatomy problems (for example, a heart defect) that might be suggestive of a chromosomal or other abnormality. We are currently not recommending a first trimester ultrasound other than that which would be ordered for dating and viability.  Should these screening tests indicate an increased concern, then the following additional testing options would be offered:  The chorionic villus sampling procedure is available for first trimester chromosome analysis.  This involves the withdrawal of a small amount of chorionic villi (tissue from the developing placenta).  Risk of pregnancy loss is estimated to be approximately 1 in 200 to  1 in 100 (0.5 to 1%).  There is approximately a 1% (1 in 100) chance that the CVS chromosome results will be unclear.  Chorionic villi cannot be tested for neural tube defects.      Amniocentesis involves the removal of a small amount of amniotic fluid from the sac surrounding the fetus with the use of a thin needle inserted through the maternal abdomen and uterus.  Ultrasound guidance is used throughout the procedure.  Fetal cells from amniotic fluid are directly evaluated and > 99.5% of chromosome problems and > 98% of open neural tube defects can be detected. This procedure is generally performed after the 15th week of pregnancy.  The main risks to this procedure include complications leading to miscarriage in less than 1 in 200 cases (0.5%).  Cystic Fibrosis and Spinal Muscular Atrophy (SMA) screening were also discussed with the patient. Both conditions are recessive, which means that both parents must be carriers in order to have a child with the disease.  Cystic fibrosis (CF) is one of the most common genetic conditions in persons of Caucasian ancestry.  This condition occurs in approximately 1 in 2,500 Caucasian persons and results in thickened secretions in the lungs, digestive, and reproductive systems.  For a baby to be at risk for having CF, both of the parents must be carriers for this condition.  Approximately 1 in 52 Caucasian persons is a carrier for CF.  Current carrier testing looks for the most common mutations in the gene for CF and can detect approximately 90% of carriers in the Caucasian population.  This means that the carrier screening can greatly reduce, but cannot eliminate, the chance for an individual to have a child with CF.  If an individual is found to be a carrier for CF, then carrier testing would be available for the partner. As part of [name] newborn screening profile, all babies born in the state of [place] will have a two-tier screening process.  Specimens are first tested to determine the concentration of immunoreactive trypsinogen (IRT).  The top 5% of specimens with the highest IRT values then undergo DNA testing using a panel of  over 40 common CF mutations. SMA is a neurodegenerative disorder that leads to atrophy of skeletal muscle and overall weakness.  This condition is also more prevalent in the Caucasian population, with 1 in 40-1 in 60 persons being a carrier and 1 in 6,000-1 in 10,000 children being affected.  There are multiple forms of the disease, with some causing death in infancy to other forms with survival into adulthood.  The genetics of SMA is complex, but carrier screening can detect up to 95% of carriers in the Caucasian population.  Similar to CF, a negative result can greatly reduce, but cannot eliminate, the chance to have a child with SMA. The patient declined carrier screening for CF and SMA.  We talked about the option of signing up for Early Check to have the baby tested for SMA after delivery as part of a new study in [place].  This registration can be done online prior to delivery if desired.  We obtained a detailed family history and pregnancy history.  Jamie Dodson reported that one maternal aunt has one leg shorter than the other.  Asymmetric growth can occur for several reasons including some genetic syndromes.  We would need additional information to assess the chance that other family members may be at risk for a similar condition.  She also reported one maternal first  cousin who was born with hydrocephaly.  This cousin has no there birth differences or health conditions, but does have some developmental delays due to her condition.  She was able to go to school and works, but is an adult and cannot live independently. Hydrocephaly, or excess fluid in the brain, can have various causes.  These include isolated birth defects, chromosome conditions, genetic syndromes (specifically X linked aqueductal stenosis), infections, hemorrhage, or a mass.  Without a specific diagnosis of the underlying cause for her condition, we cannot accurately determine the recurrence chance for other family members.  If more is learned, we  are happy to discuss this further. The remainder of the family history was reported to be unremarkable for birth defects, intellectual delays, recurrent pregnancy loss or known chromosome abnormalities.  This is the first pregnancy for this couple.  Jamie Dodson reported no complications or exposures to medications, alcohol, tobacco or recreational drugs that would be expected to increase the risk for birth defects.  Notes from her OB stated that she was using CBD gummies for anxiety early in pregnancy.  They encouraged her to avoid those during pregnancy.  We reviewed that because many supplements, including CBD gummies, are not FDA approved, they are also not regulated by the FDA.  Therefore, it can be unpredictable how much of any ingredient is in these supplements. In addition, there is limited data on the use of such substances in pregnancy to determine their safety. We encouraged Jamie Dodson to speak with her OB if she feels that her anxiety is increasing or needs additional treatment.  An ultrasound at the time of the visit confirmed the gestational age to be 13 weeks.  The detailed fetal anatomy could not be evaluated due to the early gestational age.  We recommend a second trimester ultrasound for anatomy.  After consideration of the options, Jamie Dodson elected to have MaterniT21 PLUS with SCA drawn at [facility] today.  She declined carrier testing for hemoglobinopathies, CF and SMA.  The patient was encouraged to call with questions or concerns.  We can be contacted at [phone].  Labs ordered: MaterniT21 PLUS with SCA  Cherly Anderson, MS, CGC

## 2019-06-13 LAB — MATERNIT21 PLUS CORE+SCA
Fetal Fraction: 10
Monosomy X (Turner Syndrome): NOT DETECTED
Result (T21): NEGATIVE
Trisomy 13 (Patau syndrome): NEGATIVE
Trisomy 18 (Edwards syndrome): NEGATIVE
Trisomy 21 (Down syndrome): NEGATIVE
XXX (Triple X Syndrome): NOT DETECTED
XXY (Klinefelter Syndrome): NOT DETECTED
XYY (Jacobs Syndrome): NOT DETECTED

## 2019-06-15 ENCOUNTER — Telehealth: Payer: Self-pay | Admitting: Obstetrics and Gynecology

## 2019-06-15 NOTE — Telephone Encounter (Signed)
The patient was informed of the results of her recent MaterniT21 testing which yielded NEGATIVE results.  The patient's specimen showed DNA consistent with two copies of chromosomes 21, 18 and 13.  The sensitivity for trisomy 73, trisomy 77 and trisomy 21 using this testing are reported as 99.1%, 99.9% and 91.7% respectively.  Thus, while the results of this testing are highly accurate, they are not considered diagnostic at this time.  Should more definitive information be desired, the patient may still consider amniocentesis.   As requested to know by the patient, sex chromosome analysis was included for this sample. Results were given to her mother by phone at the time of our conversation per patient request. This is predicted with >99% accuracy.  A maternal serum AFP only should be considered if screening for neural tube defects is desired.  We may be reached at [phone] with any questions or concerns.   Cherly Anderson, MS, CGC

## 2019-06-23 ENCOUNTER — Other Ambulatory Visit: Payer: Self-pay

## 2019-06-23 ENCOUNTER — Ambulatory Visit: Payer: Medicaid Other | Admitting: Family Medicine

## 2019-06-23 ENCOUNTER — Ambulatory Visit: Payer: Self-pay

## 2019-06-23 VITALS — BP 119/74 | HR 94 | Temp 94.0°F | Wt 130.4 lb

## 2019-06-23 DIAGNOSIS — Z3400 Encounter for supervision of normal first pregnancy, unspecified trimester: Secondary | ICD-10-CM

## 2019-06-23 DIAGNOSIS — F1290 Cannabis use, unspecified, uncomplicated: Secondary | ICD-10-CM

## 2019-06-23 DIAGNOSIS — O26891 Other specified pregnancy related conditions, first trimester: Secondary | ICD-10-CM

## 2019-06-23 DIAGNOSIS — F1310 Sedative, hypnotic or anxiolytic abuse, uncomplicated: Secondary | ICD-10-CM

## 2019-06-23 DIAGNOSIS — Z6791 Unspecified blood type, Rh negative: Secondary | ICD-10-CM

## 2019-06-23 NOTE — Progress Notes (Signed)
Patient here for MH RV at 15 2/7. Rh negative counseling done today and literature given. AFP only today.Burt Knack, RN

## 2019-06-23 NOTE — Progress Notes (Signed)
   PRENATAL VISIT NOTE  Subjective:  Jamie Dodson is a 19 y.o. G1P0 at [redacted]w[redacted]d being seen today for ongoing prenatal care.  She is currently monitored for the following issues for this low-risk pregnancy and has Panic disorder with agoraphobia; Fever blister; Benzodiazepine abuse (HCC); Supervision of normal first pregnancy, antepartum; Marijuana use; and Rh negative status during pregnancy in first trimester on their problem list.  Patient reports no complaints.  Contractions: Not present. Vag. Bleeding: None.  Movement: Absent. Denies leaking of fluid/ROM.   The following portions of the patient's history were reviewed and updated as appropriate: allergies, current medications, past family history, past medical history, past social history, past surgical history and problem list. Problem list updated.  Objective:   Vitals:   06/23/19 1346  BP: 119/74  Pulse: 94  Temp: (!) 94 F (34.4 C)  Weight: 130 lb 6.4 oz (59.1 kg)    Fetal Status: Fetal Heart Rate (bpm): 150 Fundal Height: 15 cm Movement: Absent     General:  Alert, oriented and cooperative. Patient is in no acute distress.  Skin: Skin is warm and dry. No rash noted.   Cardiovascular: Normal heart rate noted  Respiratory: Normal respiratory effort, no problems with respiration noted  Abdomen: Soft, gravid, appropriate for gestational age.  Pain/Pressure: Absent     Pelvic: Cervical exam deferred        Extremities: Normal range of motion.  Edema: None  Mental Status: Normal mood and affect. Normal behavior. Normal judgment and thought content.   Assessment and Plan:  Pregnancy: G1P0 at [redacted]w[redacted]d  1. Rh negative status during pregnancy in first trimester Counseled by RN Reviewed rh testing for infant at birth  2. Supervision of normal first pregnancy, antepartum Up to date Reviewed NIPT results - AFP Only [facility]- Scanned result  3. Marijuana use UDS pos 05/26/2019  4. Benzodiazepine abuse (HCC) UDS benzo neg  05/26/19   Preterm labor symptoms and general obstetric precautions including but not limited to vaginal bleeding, contractions, leaking of fluid and fetal movement were reviewed in detail with the patient. Please refer to After Visit Summary for other counseling recommendations.  Return in about 4 weeks (around 07/21/2019) for Routine prenatal care.  Future Appointments  Date Time Provider Department Center  07/16/2019  2:00 PM [facility] [place] [facility]    Federico Flake, [place]

## 2019-07-13 ENCOUNTER — Other Ambulatory Visit: Payer: Self-pay | Admitting: Obstetrics and Gynecology

## 2019-07-13 DIAGNOSIS — Z3689 Encounter for other specified antenatal screening: Secondary | ICD-10-CM

## 2019-07-16 ENCOUNTER — Other Ambulatory Visit: Payer: Self-pay

## 2019-07-16 ENCOUNTER — Ambulatory Visit
Admission: RE | Admit: 2019-07-16 | Discharge: 2019-07-16 | Disposition: A | Discharge status: Home / Self Care | Payer: Medicaid Other | Source: Ambulatory Visit | Attending: Obstetrics and Gynecology | Admitting: Obstetrics and Gynecology

## 2019-07-16 DIAGNOSIS — Z3689 Encounter for other specified antenatal screening: Secondary | ICD-10-CM | POA: Insufficient documentation

## 2019-07-16 DIAGNOSIS — Z6791 Unspecified blood type, Rh negative: Secondary | ICD-10-CM

## 2019-07-16 DIAGNOSIS — Z3A18 18 weeks gestation of pregnancy: Secondary | ICD-10-CM | POA: Insufficient documentation

## 2019-07-16 DIAGNOSIS — O26891 Other specified pregnancy related conditions, first trimester: Secondary | ICD-10-CM

## 2019-07-21 ENCOUNTER — Ambulatory Visit: Payer: Medicaid Other | Admitting: Family Medicine

## 2019-07-21 ENCOUNTER — Encounter: Payer: Self-pay | Admitting: Family Medicine

## 2019-07-21 ENCOUNTER — Other Ambulatory Visit: Payer: Self-pay

## 2019-07-21 VITALS — BP 116/70 | HR 95 | Temp 97.2°F | Wt 137.0 lb

## 2019-07-21 DIAGNOSIS — F1290 Cannabis use, unspecified, uncomplicated: Secondary | ICD-10-CM

## 2019-07-21 DIAGNOSIS — Z3400 Encounter for supervision of normal first pregnancy, unspecified trimester: Secondary | ICD-10-CM

## 2019-07-21 DIAGNOSIS — O26891 Other specified pregnancy related conditions, first trimester: Secondary | ICD-10-CM

## 2019-07-21 DIAGNOSIS — Z6791 Unspecified blood type, Rh negative: Secondary | ICD-10-CM

## 2019-07-21 DIAGNOSIS — F4001 Agoraphobia with panic disorder: Secondary | ICD-10-CM

## 2019-07-21 NOTE — Progress Notes (Signed)
Here today for 19.2 week MH RV. Taking PNV QD. Denies ED/hospital visits since last RV. Kept 07/16/19 [facility] [place] appt. Tawny Hopping, RN

## 2019-07-21 NOTE — Progress Notes (Signed)
  PRENATAL VISIT NOTE  Subjective:  Jamie Dodson is a 19 y.o. G1P0 at [redacted]w[redacted]d being seen today for ongoing prenatal care.  She is currently monitored for the following issues for this low-risk pregnancy and has Panic disorder with agoraphobia; Fever blister; Benzodiazepine abuse (HCC); Supervision of normal first pregnancy, antepartum; Marijuana use; and Rh negative status during pregnancy in first trimester on their problem list.  Patient reports occ pains on bilateral sides of lower abd, come/go xwks.  Contractions: Not present. Vag. Bleeding: None.  Movement: Present. Denies leaking of fluid/ROM.   The following portions of the patient's history were reviewed and updated as appropriate: allergies, current medications, past family history, past medical history, past social history, past surgical history and problem list. Problem list updated.  Objective:   Vitals:   07/21/19 1505  BP: 116/70  Pulse: 95  Temp: (!) 97.2 F (36.2 C)  Weight: 137 lb (62.1 kg)    Fetal Status: Fetal Heart Rate (bpm): 155 Fundal Height: 19 cm Movement: Present     General:  Alert, oriented and cooperative. Patient is in no acute distress.  Skin: Skin is warm and dry. No rash noted.   Cardiovascular: Normal heart rate noted  Respiratory: Normal respiratory effort, no problems with respiration noted  Abdomen: Soft, gravid, appropriate for gestational age.  Pain/Pressure: Absent     Pelvic: Cervical exam deferred        Extremities: Normal range of motion.  Edema: None  Mental Status: Normal mood and affect. Normal behavior. Normal judgment and thought content.   Assessment and Plan:  Pregnancy: G1P0 at [redacted]w[redacted]d   1. Supervision of normal first pregnancy, antepartum -Reviewed 07/16/19 DP anatomy US: placenta anterior, 3VC, WNL though unable to see cervical spine well. Per pt DP said f/u is optional but not necessary. Pt would like this and will call to schedule.  -Re occ bilateral abd pain: discussed round  ligament pain. Advised rest, sitting in a warm bath or taking tylenol. Handout with further information and instructions given. Advised if pain worsens acutely to be seen by a medical provider.  2. Rh negative status during pregnancy in first trimester -Needs repeat screen and rhogam at 28 wks  3. Marijuana use -She has reduced to 1 CBD gummy every other day. Congratulated on reduction and encouraged complete cessation. Offered beh health referral as pt endorses CBDs helped w/anxiety and now she is reducing, but pt declines. States she uses self relaxation/breathing techniques.  4. Panic disorder with agoraphobia See #3    Preterm labor symptoms and general obstetric precautions including but not limited to vaginal bleeding, contractions, leaking of fluid and fetal movement were reviewed in detail with the patient. Please refer to After Visit Summary for other counseling recommendations.  Return in about 4 weeks (around 08/18/2019) for routine prenatal care.  Future Appointments  Date Time Provider Department Center  08/18/2019 10:00 AM [facility] PROVIDER [facility] None    Ann Held, PA-C

## 2019-08-18 ENCOUNTER — Encounter: Payer: Self-pay | Admitting: Family Medicine

## 2019-08-18 ENCOUNTER — Other Ambulatory Visit: Payer: Self-pay | Admitting: Family Medicine

## 2019-08-18 ENCOUNTER — Ambulatory Visit: Payer: Medicaid Other | Admitting: Family Medicine

## 2019-08-18 ENCOUNTER — Other Ambulatory Visit: Payer: Self-pay

## 2019-08-18 VITALS — BP 117/74 | HR 102 | Temp 97.1°F | Wt 142.8 lb

## 2019-08-18 DIAGNOSIS — F411 Generalized anxiety disorder: Secondary | ICD-10-CM

## 2019-08-18 DIAGNOSIS — F1290 Cannabis use, unspecified, uncomplicated: Secondary | ICD-10-CM

## 2019-08-18 DIAGNOSIS — Z6791 Unspecified blood type, Rh negative: Secondary | ICD-10-CM

## 2019-08-18 DIAGNOSIS — Z3400 Encounter for supervision of normal first pregnancy, unspecified trimester: Secondary | ICD-10-CM

## 2019-08-18 DIAGNOSIS — O26891 Other specified pregnancy related conditions, first trimester: Secondary | ICD-10-CM

## 2019-08-18 MED ORDER — SERTRALINE HCL 25 MG PO TABS: ORAL_TABLET | ORAL | 3 refills | Status: DC

## 2019-08-18 NOTE — Progress Notes (Signed)
In for visit; taking PNV; denies hospital visits since last appt; PHQ9 & CCNC forms completed; discussed labs needed @ f/u visit & Rhophylac ~28 wks Sharlette Dense, RN

## 2019-08-18 NOTE — Progress Notes (Signed)
PRENATAL VISIT NOTE  Subjective:  Jamie Dodson is a 19 y.o. G1P0 at [redacted]w[redacted]d being seen today for ongoing prenatal care.  She is currently monitored for the following issues for this high-risk pregnancy and has Panic disorder with agoraphobia; Fever blister; Benzodiazepine abuse (HCC); Supervision of normal first pregnancy, antepartum; Marijuana use; Rh negative status during pregnancy in first trimester; and GAD (generalized anxiety disorder) on their problem list.  Patient reports she has been having worsening anxiety. GAD-7 score 17, phq-9 score 9 today. Pt's mom present at visit, states pt worries about everyday things, going to grocery store, etc, which pt confirms. She describes as a constant worry, need to fix things. Denies oscillating between episodes of feeling anxious/jittery followed by episodes of feeling very low. Describes this as a chronic issue getting worse. Pt has no formal diagnosis of anxiety in the past, has not been prescribed medication, though has been to ER for panic attacks in the past. She would like to start therapy and medication today.   Contractions: Not present. Vag. Bleeding: None.  Movement: Present. Denies leaking of fluid/ROM.   The following portions of the patient's history were reviewed and updated as appropriate: allergies, current medications, past family history, past medical history, past social history, past surgical history and problem list. Problem list updated.  Objective:   Vitals:   08/18/19 1009  BP: 117/74  Pulse: (!) 102  Temp: (!) 97.1 F (36.2 C)  Weight: 142 lb 12.8 oz (64.8 kg)    Fetal Status: Fetal Heart Rate (bpm): 150 Fundal Height: 23 cm Movement: Present     General:  Alert, oriented and cooperative. Patient is in no acute distress.   Skin: Skin is warm and dry. No rash noted.   Cardiovascular: Normal heart rate noted  Respiratory: Normal respiratory effort, no problems with respiration noted  Abdomen: Soft, gravid, appropriate  for gestational age.  Pain/Pressure: Absent     Pelvic: Cervical exam deferred        Extremities: Normal range of motion.  Edema: None  Mental Status: Normal mood and affect. Normal behavior. Normal judgment and thought content.   Assessment and Plan:  Pregnancy: G1P0 at [redacted]w[redacted]d   1. Supervision of normal first pregnancy, antepartum -Up to date.  2. GAD (generalized anxiety disorder) -Discussed with Dr. Alvester Morin. As pt has no reports of bipolar-like highs and lows this is likely GAD, with GAD-7 score of 17. As these symptoms have been present x months/years and are worsening now she is interested in both therapy and medication today.  -Rx sertraline w/instructions as below. Discussed risks/benefits of this medication in pregnancy and pt states understanding and would like to take. Advised if anxiety symptoms worsen w/medication to RTC/seek medical care immediately. -Referral placed today to beh health today to initiate therapy. -No si/hi but if present pt to go to ER. - sertraline (ZOLOFT) 25 MG tablet; Take 1 pill (25mg ) once daily. After 1 week increase to 2 pills daily.  Dispense: 60 tablet; Refill: 3 - Ambulatory referral to Behavioral Health  3. Marijuana use -She continues using CBD gummies "a few times per day," is hoping to cut back on this w/initiation of anxiety medication and therapy. Encouraged in pt's goal of cessation. She agrees to 2nd trimester UDS today. Drug Screen  4. Rh negative status during pregnancy in first trimester -Will need repeat antibody screen & Rhophylac at 28 wks.   Preterm labor symptoms and general obstetric precautions including but not limited to  vaginal bleeding, contractions, leaking of fluid and fetal movement were reviewed in detail with the patient. Please refer to After Visit Summary for other counseling recommendations.  Return in about 2 weeks (around 09/01/2019) for routine prenatal care.  Future Appointments  Date Time Provider  [place]  09/03/2019  3:40 PM [facility] PROVIDER [facility] None    Kandee Keen, PA-C

## 2019-08-18 NOTE — Telephone Encounter (Signed)
Rx needs pt name on it [facility] call [facility] [place].

## 2019-08-18 NOTE — Telephone Encounter (Signed)
Returned phone call to pharmacy per phone note. Per pharmacist at [facility] [place] location. Patient took the prescription with her "maybe she is bringing it back to y'all to get a name put on it." RN will monitor for above. Tawny Hopping, RN

## 2019-08-19 ENCOUNTER — Telehealth: Payer: Self-pay | Admitting: Licensed Clinical Social Worker

## 2019-08-19 NOTE — Telephone Encounter (Signed)
-----   Message from Ellison Carwin sent at 08/14/2019  1:54 PM EDT ----- Regarding: referral Evening Ms. Marchelle Folks. When you return to the office, at your earliest convenience, would you please f/u with this member.  She reports history of severe anxiety. Thanks,  Rasheda

## 2019-08-21 NOTE — Progress Notes (Signed)
Attestation of Attending Supervision of Advanced Practitioner: Evaluation and management procedures were performed by the Advanced Practioner with my collaboration.  I have reviewed the PA's note and chart, and I agree with the management and plan. I was consulted and the documentation reflects our discussion. Appreciate thorough documentation and characterization of patient's anxiety.  Patient at risk for pp worsening of anxiety. Recommend 2-3 wk visit with behavioral health or clinic.   Federico Flake, MD, MPH, ABFM Medical Director [facility] Department

## 2019-08-23 LAB — 789231 7+OXYCODONE-BUND
Amphetamines, Urine: NEGATIVE ng/mL
BENZODIAZ UR QL: NEGATIVE ng/mL
Barbiturate screen, urine: NEGATIVE ng/mL
Cocaine (Metab.): NEGATIVE ng/mL
OPIATE SCREEN URINE: NEGATIVE ng/mL
Oxycodone/Oxymorphone, Urine: NEGATIVE ng/mL
PCP Quant, Ur: NEGATIVE ng/mL

## 2019-08-23 LAB — CANNABINOID CONFIRMATION, UR
CANNABINOIDS: POSITIVE — AB
Carboxy THC GC/MS Conf: 221 ng/mL

## 2019-09-03 ENCOUNTER — Ambulatory Visit: Payer: Medicaid Other | Admitting: Licensed Clinical Social Worker

## 2019-09-03 ENCOUNTER — Telehealth: Payer: Self-pay

## 2019-09-03 ENCOUNTER — Ambulatory Visit: Payer: Medicaid Other

## 2019-09-03 NOTE — Telephone Encounter (Signed)
Left voicemail message @ contact # Sharlette Dense, RN

## 2019-09-03 NOTE — Telephone Encounter (Signed)
Attempted to call to reschedule appt due to provider out today; no answer -"call cannot be completed" Sharlette Dense, RN

## 2019-09-03 NOTE — Telephone Encounter (Signed)
Attempted to call again-left voicemail @ client # Sharlette Dense, RN

## 2019-09-07 ENCOUNTER — Encounter: Payer: Self-pay | Admitting: Family Medicine

## 2019-09-07 ENCOUNTER — Ambulatory Visit: Payer: Medicaid Other | Admitting: Family Medicine

## 2019-09-07 ENCOUNTER — Other Ambulatory Visit: Payer: Self-pay

## 2019-09-07 VITALS — BP 123/78 | HR 90 | Temp 97.7°F | Wt 143.6 lb

## 2019-09-07 DIAGNOSIS — Z6791 Unspecified blood type, Rh negative: Secondary | ICD-10-CM

## 2019-09-07 DIAGNOSIS — F411 Generalized anxiety disorder: Secondary | ICD-10-CM

## 2019-09-07 DIAGNOSIS — O26891 Other specified pregnancy related conditions, first trimester: Secondary | ICD-10-CM

## 2019-09-07 DIAGNOSIS — Z3400 Encounter for supervision of normal first pregnancy, unspecified trimester: Secondary | ICD-10-CM

## 2019-09-07 MED ORDER — SERTRALINE HCL 25 MG PO TABS: 75.0000 mg | ORAL_TABLET | Freq: Every day | ORAL | 3 refills | Status: DC

## 2019-09-07 NOTE — Progress Notes (Signed)
  PRENATAL VISIT NOTE  Subjective:  Jamie Dodson is a 19 y.o. G1P0 at [redacted]w[redacted]d being seen today for ongoing prenatal care.  She is currently monitored for the following issues for this high-risk pregnancy and has Panic disorder with agoraphobia; Fever blister; Benzodiazepine abuse (HCC); Supervision of normal first pregnancy, antepartum; Marijuana use; Rh negative status during pregnancy in first trimester; and GAD (generalized anxiety disorder) on their problem list.  Patient reports she is taking 50mg  of zoloft in the morning for GAD. She is feeling better than prior visit, but still endorses feeling angry, need to control things and clean excessively. Symptoms worse in the mornings. Contractions: Not present. Vag. Bleeding: None.  Movement: Present. Denies leaking of fluid/ROM.   The following portions of the patient's history were reviewed and updated as appropriate: allergies, current medications, past family history, past medical history, past social history, past surgical history and problem list. Problem list updated.  Objective:   Vitals:   09/07/19 0848  BP: 123/78  Pulse: 90  Temp: 97.7 F (36.5 C)  Weight: 143 lb 9.6 oz (65.1 kg)    Fetal Status: Fetal Heart Rate (bpm): 150 Fundal Height: 26 cm Movement: Present     General:  Alert, oriented and cooperative. Patient is in no acute distress. Smiles, converses easily.  Skin: Skin is warm and dry. No rash noted.   Cardiovascular: Normal heart rate noted  Respiratory: Normal respiratory effort, no problems with respiration noted  Abdomen: Soft, gravid, appropriate for gestational age.  Pain/Pressure: Absent     Pelvic: Cervical exam deferred        Extremities: Normal range of motion.  Edema: None  Mental Status: Normal mood and affect. Normal behavior. Normal judgment and thought content.   Assessment and Plan:  Pregnancy: G1P0 at [redacted]w[redacted]d    1. Supervision of normal first pregnancy, antepartum -Up to date. Anticipatory  guidance given regarding next visit w/labs at 28 wks.  2. GAD (generalized anxiety disorder) -Advised to increase to 75mg  daily and if after 1 wk symptoms not improving to 100mg  daily. Encouraged to keep in close contact and let [redacted]w[redacted]d know if sx worsen.  -She has not yet had LCSW appt, encouraged to set this up asap. - sertraline (ZOLOFT) 25 MG tablet; Take 3 tablets (75 mg total) by mouth daily.  Dispense: 90 tablet; Refill: 3  3. Rh negative status during pregnancy in first trimester -Needs rhogam and labs at next visit.   Preterm labor symptoms and general obstetric precautions including but not limited to vaginal bleeding, contractions, leaking of fluid and fetal movement were reviewed in detail with the patient. Please refer to After Visit Summary for other counseling recommendations.  Return in about 2 weeks (around 09/21/2019) for routine prenatal care.  Future Appointments  Date Time Provider Department Center  09/22/2019  8:20 AM [facility] PROVIDER [facility] None    [place], PA-C

## 2019-09-07 NOTE — Progress Notes (Signed)
Patient here for MH RV at 26 1/7. Patient counseled she will need to return in 2 weeks for 28 week labs, rhophylac, Tdap, 1 hour glucose test. Patient states understanding.Burt Knack, RN

## 2019-09-22 ENCOUNTER — Ambulatory Visit: Payer: Medicaid Other | Admitting: Family Medicine

## 2019-09-22 ENCOUNTER — Encounter: Payer: Self-pay | Admitting: Family Medicine

## 2019-09-22 ENCOUNTER — Other Ambulatory Visit: Payer: Self-pay

## 2019-09-22 VITALS — BP 116/67 | HR 93 | Temp 97.1°F | Wt 150.2 lb

## 2019-09-22 DIAGNOSIS — D649 Anemia, unspecified: Secondary | ICD-10-CM | POA: Insufficient documentation

## 2019-09-22 DIAGNOSIS — O26891 Other specified pregnancy related conditions, first trimester: Secondary | ICD-10-CM

## 2019-09-22 DIAGNOSIS — O99012 Anemia complicating pregnancy, second trimester: Secondary | ICD-10-CM

## 2019-09-22 DIAGNOSIS — Z23 Encounter for immunization: Secondary | ICD-10-CM | POA: Diagnosis not present

## 2019-09-22 DIAGNOSIS — Z6791 Unspecified blood type, Rh negative: Secondary | ICD-10-CM | POA: Diagnosis not present

## 2019-09-22 DIAGNOSIS — F411 Generalized anxiety disorder: Secondary | ICD-10-CM

## 2019-09-22 DIAGNOSIS — Z3400 Encounter for supervision of normal first pregnancy, unspecified trimester: Secondary | ICD-10-CM | POA: Diagnosis not present

## 2019-09-22 HISTORY — DX: Anemia complicating pregnancy, second trimester: O99.012

## 2019-09-22 LAB — HEMOGLOBIN, FINGERSTICK: Hemoglobin: 10.5 g/dL — ABNORMAL LOW (ref 11.1–15.9)

## 2019-09-22 MED ORDER — FERROUS SULFATE 324 (65 FE) MG PO TBEC
1.0000 | DELAYED_RELEASE_TABLET | Freq: Every day | ORAL | 0 refills | Status: DC
Start: 2019-09-22 — End: 2021-05-08

## 2019-09-22 MED ORDER — RHO D IMMUNE GLOBULIN 1500 UNIT/2ML IJ SOSY
300.0000 ug | PREFILLED_SYRINGE | Freq: Once | INTRAMUSCULAR | Status: AC
  Administered 2019-09-22: 300 ug via INTRAMUSCULAR

## 2019-09-22 NOTE — Progress Notes (Signed)
Rhophylac given today, left gluteus muscle, tolerated well. Antibody screen done. Ferrous sulfate initiated today, patient counseled to take 1 tablet each day with vitamin C juice, anemia pamphlet given, and patient will need Hgb recheck in 1 month. Tdap given today, left deltoid, tolerated well, VIS and NCIR given.Burt Knack, RN

## 2019-09-22 NOTE — Progress Notes (Signed)
   PRENATAL VISIT NOTE  Subjective:  Jamie Dodson is a 19 y.o. G1P0 at [redacted]w[redacted]d being seen today for ongoing prenatal care.  She is currently monitored for the following issues for this low-risk pregnancy and has Panic disorder with agoraphobia; Fever blister; Benzodiazepine abuse (HCC); Supervision of normal first pregnancy, antepartum; Marijuana use; Rh negative status during pregnancy in first trimester; and GAD (generalized anxiety disorder) on their problem list.  Patient reports no complaints.  Contractions: Not present. Vag. Bleeding: None.  Movement: Present. Denies leaking of fluid/ROM.   The following portions of the patient's history were reviewed and updated as appropriate: allergies, current medications, past family history, past medical history, past social history, past surgical history and problem list. Problem list updated.  Objective:   Vitals:   09/22/19 0818  BP: 116/67  Pulse: 93  Temp: (!) 97.1 F (36.2 C)  Weight: 150 lb 3.2 oz (68.1 kg)    Fetal Status: Fetal Heart Rate (bpm): 140 Fundal Height: 29 cm Movement: Present     General:  Alert, oriented and cooperative. Patient is in no acute distress.  Skin: Skin is warm and dry. No rash noted.   Cardiovascular: Normal heart rate noted  Respiratory: Normal respiratory effort, no problems with respiration noted  Abdomen: Soft, gravid, appropriate for gestational age.  Pain/Pressure: Absent     Pelvic: Cervical exam deferred        Extremities: Normal range of motion.  Edema: None  Mental Status: Normal mood and affect. Normal behavior. Normal judgment and thought content.   Assessment and Plan:  Pregnancy: G1P0 at [redacted]w[redacted]d  1. Rh negative status during pregnancy in first trimester Rhogam today Repeat antibody screen  2. Supervision of normal first pregnancy, antepartum - HIV, RPR, Gtt today - Tdap given - Discussed reproductive life plan and infant feeding - Patient is enrolled in [facility] and encouraged BF peer  support through [facility] - Discussed doula program and patient is very interested in referral  3. Anxiety/Depression - much improved on zoloft per patient - reviewed plan to continue through pregnancy and postpartum - recommended 2 wk pp mood check   Preterm labor symptoms and general obstetric precautions including but not limited to vaginal bleeding, contractions, leaking of fluid and fetal movement were reviewed in detail with the patient.  Please refer to After Visit Summary for other counseling recommendations.   Return in about 2 weeks (around 10/06/2019) for Routine prenatal care, in person.  No future appointments.  Federico Flake, MD

## 2019-09-22 NOTE — Progress Notes (Signed)
Patient here for MH RV at 28 2/7. 28 week labs today, 1 hour gtt and Tdap. Needs to be lab by 9:10am, for 9:20 blood draw. Burt Knack, RN

## 2019-09-23 ENCOUNTER — Telehealth: Payer: Self-pay

## 2019-09-23 ENCOUNTER — Encounter: Payer: Self-pay | Admitting: Family Medicine

## 2019-09-23 DIAGNOSIS — O99810 Abnormal glucose complicating pregnancy: Secondary | ICD-10-CM | POA: Insufficient documentation

## 2019-09-23 LAB — RPR: RPR Ser Ql: NONREACTIVE

## 2019-09-23 LAB — FE+CBC/D/PLT+TIBC+FER+RETIC
Basophils Absolute: 0 10*3/uL (ref 0.0–0.2)
Basos: 0 %
EOS (ABSOLUTE): 0.1 10*3/uL (ref 0.0–0.4)
Eos: 1 %
Ferritin: 23 ng/mL (ref 15–150)
Hematocrit: 30.6 % — ABNORMAL LOW (ref 34.0–46.6)
Hemoglobin: 10.2 g/dL — ABNORMAL LOW (ref 11.1–15.9)
Immature Grans (Abs): 0.2 10*3/uL — ABNORMAL HIGH (ref 0.0–0.1)
Immature Granulocytes: 1 %
Iron Saturation: 13 % — ABNORMAL LOW (ref 15–55)
Iron: 57 ug/dL (ref 27–159)
Lymphocytes Absolute: 1.6 10*3/uL (ref 0.7–3.1)
Lymphs: 11 %
MCH: 29.1 pg (ref 26.6–33.0)
MCHC: 33.3 g/dL (ref 31.5–35.7)
MCV: 87 fL (ref 79–97)
Monocytes Absolute: 0.6 10*3/uL (ref 0.1–0.9)
Monocytes: 5 %
Neutrophils Absolute: 11.4 10*3/uL — ABNORMAL HIGH (ref 1.4–7.0)
Neutrophils: 82 %
Platelets: 246 10*3/uL (ref 150–450)
RBC: 3.51 x10E6/uL — ABNORMAL LOW (ref 3.77–5.28)
RDW: 13 % (ref 11.7–15.4)
Retic Ct Pct: 1.6 % (ref 0.6–2.6)
Total Iron Binding Capacity: 436 ug/dL (ref 250–450)
UIBC: 379 ug/dL (ref 131–425)
WBC: 13.9 10*3/uL — ABNORMAL HIGH (ref 3.4–10.8)

## 2019-09-23 LAB — GLUCOSE, 1 HOUR GESTATIONAL: Gestational Diabetes Screen: 167 mg/dL — ABNORMAL HIGH (ref 65–139)

## 2019-09-23 LAB — ANTIBODY SCREEN: Antibody Screen: NEGATIVE

## 2019-09-23 LAB — HIV ANTIBODY (ROUTINE TESTING W REFLEX): HIV Screen 4th Generation wRfx: NONREACTIVE

## 2019-09-23 NOTE — Telephone Encounter (Signed)
One hour glucola result = 167 and needs  3 hr GTT. Left message to call with number to call provided. Jossie Ng, RN

## 2019-09-23 NOTE — Telephone Encounter (Signed)
One hour glucola result = 167 and needs 3 hr GTT. Left message to call and number to call provided. Jossie Ng, RN

## 2019-09-23 NOTE — Telephone Encounter (Signed)
Patient returned phone call. Informed of abnormal 1hgtt lab results. Scheduled 3hgtt for 09/28/19 @ 8:20. Instructed to arrive at 8:00 am and to not eat or drink anything after 8:00 pm 09/27/2019 until 3hgtt lab completed the next day. Patient verbalized understanding of above. Tawny Hopping, RN

## 2019-09-23 NOTE — Telephone Encounter (Signed)
One hour glucola result = 167 and needs  °3 hr GTT. Left message to call with number to call provided. Lynnette Warren, RN ° °

## 2019-09-28 ENCOUNTER — Other Ambulatory Visit: Payer: Medicaid Other

## 2019-09-28 ENCOUNTER — Other Ambulatory Visit: Payer: Self-pay

## 2019-09-28 DIAGNOSIS — O99810 Abnormal glucose complicating pregnancy: Secondary | ICD-10-CM

## 2019-09-28 NOTE — Progress Notes (Signed)
Patient in nurse clinic today for 3 hour GTT. Instructions given, patient sent to lab. No history of travel noted.  Pt. Reports npo since 8:30 pm last night (09/27/19).Pt. To wait in A/B conference room where it is cool  during visit d/t malfunctioning air conditioner in clinic space. Jerel Shepherd, RN

## 2019-09-29 LAB — GLUCOSE TOLERANCE TEST, 6 HOUR
Glucose, 1 Hour GTT: 228 mg/dL — ABNORMAL HIGH (ref 65–199)
Glucose, 2 hour: 134 mg/dL (ref 65–139)
Glucose, 3 hour: 96 mg/dL (ref 65–109)
Glucose, GTT - Fasting: 84 mg/dL (ref 65–99)

## 2019-10-02 ENCOUNTER — Other Ambulatory Visit: Payer: Self-pay

## 2019-10-02 DIAGNOSIS — O99810 Abnormal glucose complicating pregnancy: Secondary | ICD-10-CM

## 2019-10-03 ENCOUNTER — Other Ambulatory Visit: Payer: Self-pay | Admitting: Family Medicine

## 2019-10-03 DIAGNOSIS — F411 Generalized anxiety disorder: Secondary | ICD-10-CM

## 2019-10-06 ENCOUNTER — Ambulatory Visit: Payer: Medicaid Other

## 2019-10-06 ENCOUNTER — Telehealth: Payer: Self-pay | Admitting: Dietician

## 2019-10-07 ENCOUNTER — Ambulatory Visit: Payer: Medicaid Other | Admitting: Advanced Practice Midwife

## 2019-10-07 ENCOUNTER — Other Ambulatory Visit: Payer: Self-pay

## 2019-10-07 VITALS — BP 117/73 | HR 108 | Temp 97.3°F | Wt 153.0 lb

## 2019-10-07 DIAGNOSIS — O99810 Abnormal glucose complicating pregnancy: Secondary | ICD-10-CM

## 2019-10-07 DIAGNOSIS — F1290 Cannabis use, unspecified, uncomplicated: Secondary | ICD-10-CM | POA: Diagnosis not present

## 2019-10-07 DIAGNOSIS — O99012 Anemia complicating pregnancy, second trimester: Secondary | ICD-10-CM

## 2019-10-07 DIAGNOSIS — F411 Generalized anxiety disorder: Secondary | ICD-10-CM

## 2019-10-07 DIAGNOSIS — Z3400 Encounter for supervision of normal first pregnancy, unspecified trimester: Secondary | ICD-10-CM

## 2019-10-07 LAB — URINALYSIS
Bilirubin, UA: NEGATIVE
Glucose, UA: NEGATIVE
Ketones, UA: NEGATIVE
Leukocytes,UA: NEGATIVE
Nitrite, UA: NEGATIVE
Protein,UA: NEGATIVE
RBC, UA: NEGATIVE
Specific Gravity, UA: 1.025 (ref 1.005–1.030)
Urobilinogen, Ur: 0.2 mg/dL (ref 0.2–1.0)
pH, UA: 6.5 (ref 5.0–7.5)

## 2019-10-07 MED ORDER — PREPLUS 27-1 MG PO TABS: 1.0000 | ORAL_TABLET | Freq: Every day | ORAL | 6 refills | Status: DC

## 2019-10-07 NOTE — Progress Notes (Signed)
TC to S. Senaida Ores, RD to let her know patient is in clinic today. Jamie Dodson states she is working from home today and would like a working phone # for patient. Per Jamie Dodson, she called patient recently and patient had no phone service. Patient phone verified and patient states she will pay bill today to get phone turned back on. Patient also states ok for RD to call her mother's phone at [phone] for nutrition consult. Patient is expecting call. TC to RD and left message to call maternity clinic for numbers. Patient also has a [facility] appointment on 10/26/19.Marland KitchenBurt Knack, RN

## 2019-10-07 NOTE — Progress Notes (Signed)
Patient here for MH RV at 30 3/7. .Jecenia Leamer Brewer-Jensen, RN  °

## 2019-10-07 NOTE — Addendum Note (Signed)
Addended by: Arnetha Courser on: 10/07/2019 05:00 PM   Modules accepted: Orders

## 2019-10-07 NOTE — Progress Notes (Signed)
   PRENATAL VISIT NOTE  Subjective:  Jamie Dodson is a 19 y.o. G1P0000 at [redacted]w[redacted]d being seen today for ongoing prenatal care.  She is currently monitored for the following issues for this low-risk pregnancy and has Panic disorder with agoraphobia; Fever blister; Benzodiazepine abuse (HCC); Supervision of normal first pregnancy, antepartum; Marijuana use; +UDS 05/26/19 MJ; +UDS 08/18/19 MJ; Rh negative status during pregnancy in first trimester; GAD (generalized anxiety disorder); Anemia affecting pregnancy in second trimester; and Abnormal glucose tolerance test (GTT) during pregnancy, antepartum on their problem list.  Patient reports no complaints.  Contractions: Not present. Vag. Bleeding: None.  Movement: Present. Denies leaking of fluid/ROM.   The following portions of the patient's history were reviewed and updated as appropriate: allergies, current medications, past family history, past medical history, past social history, past surgical history and problem list. Problem list updated.  Objective:   Vitals:   10/07/19 1341  BP: 117/73  Pulse: (!) 108  Temp: (!) 97.3 F (36.3 C)  Weight: 153 lb (69.4 kg)    Fetal Status: Fetal Heart Rate (bpm): 140 Fundal Height: 31 cm Movement: Present     General:  Alert, oriented and cooperative. Patient is in no acute distress.  Skin: Skin is warm and dry. No rash noted.   Cardiovascular: Normal heart rate noted  Respiratory: Normal respiratory effort, no problems with respiration noted  Abdomen: Soft, gravid, appropriate for gestational age.  Pain/Pressure: Absent     Pelvic: Cervical exam deferred        Extremities: Normal range of motion.  Edema: None  Mental Status: Normal mood and affect. Normal behavior. Normal judgment and thought content.   Assessment and Plan:  Pregnancy: G1P0000 at [redacted]w[redacted]d  1. Abnormal glucose tolerance test (GTT) during pregnancy, antepartum Equivocal 3 hr GTT on 09/28/19--[facility] nutritionist tried to call pt to schedule  MNT apt and pt's phone not accepting calls (pt states she has to pay her phone bill to get it turned on).   2. Anemia affecting pregnancy in second trimester Taking FeSo4 I daily with water  3. Supervision of normal first pregnancy, antepartum Feels well.  Not working.  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  Walks 1x/wk.  Baby shower 10/11/19.  Has 2 car seats. [facility] for Peds  4. Marijuana use; +UDS 05/26/19 MJ; +UDS 08/18/19 MJ Mother in room so pt not asked about use but agrees to UDS today  5. GAD (generalized anxiety disorder) Taking Zoloft 75 mg daily and pleased with results Please give Jamie Dodson contact info to pt -anhedonia, -cry, +moody, irritable,-SI/HI   Preterm labor symptoms and general obstetric precautions including but not limited to vaginal bleeding, contractions, leaking of fluid and fetal movement were reviewed in detail with the patient. Please refer to After Visit Summary for other counseling recommendations.  Return in about 2 weeks (around 10/21/2019) for routine PNC.  No future appointments.  Alberteen Spindle, CNM

## 2019-10-15 LAB — 789231 7+OXYCODONE-BUND
Amphetamines, Urine: NEGATIVE ng/mL
BENZODIAZ UR QL: NEGATIVE ng/mL
Barbiturate screen, urine: NEGATIVE ng/mL
Cocaine (Metab.): NEGATIVE ng/mL
OPIATE SCREEN URINE: NEGATIVE ng/mL
Oxycodone/Oxymorphone, Urine: NEGATIVE ng/mL
PCP Quant, Ur: NEGATIVE ng/mL

## 2019-10-15 LAB — CANNABINOID CONFIRMATION, UR
CANNABINOIDS: POSITIVE — AB
Carboxy THC GC/MS Conf: 150 ng/mL

## 2019-10-21 ENCOUNTER — Ambulatory Visit: Payer: Medicaid Other | Admitting: Advanced Practice Midwife

## 2019-10-21 ENCOUNTER — Encounter: Payer: Self-pay | Admitting: Advanced Practice Midwife

## 2019-10-21 ENCOUNTER — Telehealth: Payer: Self-pay | Admitting: Dietician

## 2019-10-21 ENCOUNTER — Other Ambulatory Visit: Payer: Self-pay

## 2019-10-21 VITALS — BP 117/73 | HR 98 | Temp 97.9°F | Wt 156.2 lb

## 2019-10-21 DIAGNOSIS — O99012 Anemia complicating pregnancy, second trimester: Secondary | ICD-10-CM

## 2019-10-21 DIAGNOSIS — O99340 Other mental disorders complicating pregnancy, unspecified trimester: Secondary | ICD-10-CM

## 2019-10-21 DIAGNOSIS — F32A Depression, unspecified: Secondary | ICD-10-CM

## 2019-10-21 DIAGNOSIS — F329 Major depressive disorder, single episode, unspecified: Secondary | ICD-10-CM

## 2019-10-21 DIAGNOSIS — O99810 Abnormal glucose complicating pregnancy: Secondary | ICD-10-CM

## 2019-10-21 DIAGNOSIS — Z3400 Encounter for supervision of normal first pregnancy, unspecified trimester: Secondary | ICD-10-CM

## 2019-10-21 DIAGNOSIS — F1290 Cannabis use, unspecified, uncomplicated: Secondary | ICD-10-CM

## 2019-10-21 HISTORY — DX: Other mental disorders complicating pregnancy, unspecified trimester: O99.340

## 2019-10-21 HISTORY — DX: Depression, unspecified: F32.A

## 2019-10-21 LAB — URINALYSIS
Bilirubin, UA: NEGATIVE
Glucose, UA: NEGATIVE
Ketones, UA: NEGATIVE
Leukocytes,UA: NEGATIVE
Nitrite, UA: NEGATIVE
Protein,UA: NEGATIVE
RBC, UA: NEGATIVE
Specific Gravity, UA: 1.025 (ref 1.005–1.030)
Urobilinogen, Ur: 0.2 mg/dL (ref 0.2–1.0)
pH, UA: 7 (ref 5.0–7.5)

## 2019-10-21 LAB — HEMOGLOBIN, FINGERSTICK: Hemoglobin: 11 g/dL — ABNORMAL LOW (ref 11.1–15.9)

## 2019-10-21 NOTE — Progress Notes (Signed)
In for MH RV at 32.3 weeks. Takes PNV and Iron once daily. Denies ED/hospital visit since last RV. Needs Hgb check today. Has not had nutrition counseling at this time. Sharlyne Pacas, RN

## 2019-10-21 NOTE — Progress Notes (Signed)
Reviewed urine dip and Hgb. Pt to continue FeSo4 daily with juice. This RN walked pt to [facility] for services; nutritionist working from home, will call pt after 1:00 pm today. Provider orders completed.

## 2019-10-21 NOTE — Progress Notes (Signed)
   PRENATAL VISIT NOTE  Subjective:  Jamie Dodson is a 19 y.o. G1P0000 at [redacted]w[redacted]d being seen today for ongoing prenatal care.  She is currently monitored for the following issues for this low-risk pregnancy and has Panic disorder with agoraphobia; Fever blister; Benzodiazepine abuse (HCC); Supervision of normal first pregnancy, antepartum; Marijuana use; +UDS 05/26/19 MJ; +UDS 08/18/19 MJ; +UDS 10/07/19 MJ; Rh negative status during pregnancy in first trimester; GAD (generalized anxiety disorder); Anemia affecting pregnancy in second trimester; and Abnormal glucose tolerance test (GTT) during pregnancy, antepartum on their problem list.  Patient reports no complaints.  Contractions: Not present. Vag. Bleeding: None.  Movement: Present. Denies leaking of fluid/ROM.   The following portions of the patient's history were reviewed and updated as appropriate: allergies, current medications, past family history, past medical history, past social history, past surgical history and problem list. Problem list updated.  Objective:   Vitals:   10/21/19 1037  BP: 117/73  Pulse: 98  Temp: 97.9 F (36.6 C)  Weight: 156 lb 3.2 oz (70.9 kg)    Fetal Status:   Fundal Height: 33 cm Movement: Present     General:  Alert, oriented and cooperative. Patient is in no acute distress.  Skin: Skin is warm and dry. No rash noted.   Cardiovascular: Normal heart rate noted  Respiratory: Normal respiratory effort, no problems with respiration noted  Abdomen: Soft, gravid, appropriate for gestational age.  Pain/Pressure: Absent     Pelvic: Cervical exam deferred        Extremities: Normal range of motion.  Edema: None  Mental Status: Normal mood and affect. Normal behavior. Normal judgment and thought content.   Assessment and Plan:  Pregnancy: G1P0000 at [redacted]w[redacted]d  1. Abnormal glucose tolerance test (GTT) during pregnancy, antepartum Equivocal 3 hr GTT on 09/28/19--referral made for ADA diet counseling and nutritionist  called pt but voicemail not set up; dietician called pt's mom and left message as well on 10/07/19; pt strongly urged to go to [facility] now for ADA diet counseling - Urinalysis (Urine Dip)  2. Anemia Taking I FeSo4 daily with cranberry juice - Hemoglobin, venipuncture  3. Supervision of normal first pregnancy, antepartum Started on Zoloft 09/2019 and is now at 75 mg and happy with this dose. Strongly encouraged counseling with Kathreen Cosier in addition to Zoloft Not working.  Living with fiance.  Denies cigs, vaping, MJ.  -cry, sleep wnl, increased appetite, -moody, irritable, -SI/HI  4. Marijuana use; +UDS 05/26/19 MJ; +UDS 08/18/19 MJ; +UDS 10/07/19 MJ Last use end of June.  Stopped 4-5 CBD gummies/day then as well.  Agrees to UDS today - [id number] Drug Screen  5. Panic disorder with agoraphobia    Preterm labor symptoms and general obstetric precautions including but not limited to vaginal bleeding, contractions, leaking of fluid and fetal movement were reviewed in detail with the patient. Please refer to After Visit Summary for other counseling recommendations.  Return in about 2 weeks (around 11/04/2019) for routine PNC.  No future appointments.  Alberteen Spindle, CNM

## 2019-10-29 LAB — 789231 7+OXYCODONE-BUND
Amphetamines, Urine: NEGATIVE ng/mL
BENZODIAZ UR QL: NEGATIVE ng/mL
Barbiturate screen, urine: NEGATIVE ng/mL
Cocaine (Metab.): NEGATIVE ng/mL
OPIATE SCREEN URINE: NEGATIVE ng/mL
Oxycodone/Oxymorphone, Urine: NEGATIVE ng/mL
PCP Quant, Ur: NEGATIVE ng/mL

## 2019-10-29 LAB — CANNABINOID CONFIRMATION, UR
CANNABINOIDS: POSITIVE — AB
Carboxy THC GC/MS Conf: 194 ng/mL

## 2019-11-04 ENCOUNTER — Ambulatory Visit: Payer: Medicaid Other | Admitting: Advanced Practice Midwife

## 2019-11-04 ENCOUNTER — Other Ambulatory Visit: Payer: Self-pay

## 2019-11-04 VITALS — BP 117/79 | HR 82 | Temp 96.9°F | Wt 160.0 lb

## 2019-11-04 DIAGNOSIS — F32A Depression, unspecified: Secondary | ICD-10-CM

## 2019-11-04 DIAGNOSIS — O99340 Other mental disorders complicating pregnancy, unspecified trimester: Secondary | ICD-10-CM

## 2019-11-04 DIAGNOSIS — F1290 Cannabis use, unspecified, uncomplicated: Secondary | ICD-10-CM

## 2019-11-04 DIAGNOSIS — F329 Major depressive disorder, single episode, unspecified: Secondary | ICD-10-CM

## 2019-11-04 DIAGNOSIS — Z3400 Encounter for supervision of normal first pregnancy, unspecified trimester: Secondary | ICD-10-CM

## 2019-11-04 DIAGNOSIS — O99810 Abnormal glucose complicating pregnancy: Secondary | ICD-10-CM

## 2019-11-04 DIAGNOSIS — O99012 Anemia complicating pregnancy, second trimester: Secondary | ICD-10-CM

## 2019-11-04 LAB — URINALYSIS
Bilirubin, UA: NEGATIVE
Glucose, UA: NEGATIVE
Ketones, UA: NEGATIVE
Leukocytes,UA: NEGATIVE
Nitrite, UA: NEGATIVE
Protein,UA: NEGATIVE
RBC, UA: NEGATIVE
Specific Gravity, UA: 1.03 (ref 1.005–1.030)
Urobilinogen, Ur: 0.2 mg/dL (ref 0.2–1.0)
pH, UA: 6 (ref 5.0–7.5)

## 2019-11-04 NOTE — Progress Notes (Signed)
Patient here for MH RV at 34 3/7. Email address verified, kick counts done last visit. Burt Knack, RN

## 2019-11-04 NOTE — Progress Notes (Signed)
   PRENATAL VISIT NOTE  Subjective:  Jamie Dodson is a 19 y.o. G1P0000 at [redacted]w[redacted]d being seen today for ongoing prenatal care.  She is currently monitored for the following issues for this low-risk pregnancy and has Panic disorder with agoraphobia; Fever blister; Benzodiazepine abuse (HCC); Supervision of normal first pregnancy, antepartum; Marijuana use; +UDS 05/26/19 MJ; +UDS 08/18/19 MJ; +UDS 10/07/19 MJ; +UDS MJ 10/21/19; Rh negative status during pregnancy in first trimester; GAD (generalized anxiety disorder); Anemia affecting pregnancy in second trimester; Abnormal glucose tolerance test (GTT) during pregnancy, antepartum; and Depression affecting pregnancy on their problem list.  Patient reports contractions last night q 5-10 min with low back ache that resolved.  This am diarrhea x1 and low back ache again.  Contractions: Irritability. Vag. Bleeding: None.  Movement: Present. Denies leaking of fluid/ROM.   The following portions of the patient's history were reviewed and updated as appropriate: allergies, current medications, past family history, past medical history, past social history, past surgical history and problem list. Problem list updated.  Objective:   Vitals:   11/04/19 1046  BP: 117/79  Pulse: 82  Temp: (!) 96.9 F (36.1 C)  Weight: 160 lb (72.6 kg)    Fetal Status: Fetal Heart Rate (bpm): 130 Fundal Height: 34 cm Movement: Present     General:  Alert, oriented and cooperative. Patient is in no acute distress.  Skin: Skin is warm and dry. No rash noted.   Cardiovascular: Normal heart rate noted  Respiratory: Normal respiratory effort, no problems with respiration noted  Abdomen: Soft, gravid, appropriate for gestational age.  Pain/Pressure: Present     Pelvic: Cervical exam performed Dilation: Fingertip Effacement (%): 50 Station: Ballotable  Extremities: Normal range of motion.  Edema: None  Mental Status: Normal mood and affect. Normal behavior. Normal judgment and thought  content.   Assessment and Plan:  Pregnancy: G1P0000 at [redacted]w[redacted]d  1. Abnormal glucose tolerance test (GTT) during pregnancy, antepartum Equivocal 3 hr GTT 09/28/19 with ADA diet counseling done via phone on 10/21/19  2. Anemia affecting pregnancy in second trimester Taking FeSo4 daily  3. Marijuana use; +UDS 05/26/19 MJ; +UDS 08/18/19 MJ; +UDS 10/07/19 MJ; +UDS MJ 10/21/19 Pt states last use 08/2019.  When discussed +UDS 10/21/19, pt then changes mind and says last MJ "around then".  Counseled to stop as causes increased risk for PTL  4. Depression affecting pregnancy Taking Zoloft 75 mg daily but declines counseling  5. Supervision of normal first pregnancy, antepartum Not working.  Counseled if >4 U/c's/hour to go to L&D, also go if diarrhea continues.  C&S done to r/o UTI today.  Denies dysuria, frequency, hesitancy.  RTC 1 wk   Preterm labor symptoms and general obstetric precautions including but not limited to vaginal bleeding, contractions, leaking of fluid and fetal movement were reviewed in detail with the patient. Please refer to After Visit Summary for other counseling recommendations.  Return in about 2 weeks (around 11/18/2019) for routine PNC.  No future appointments.  Alberteen Spindle, CNM

## 2019-11-05 LAB — URINE CULTURE

## 2019-11-10 LAB — 789231 7+OXYCODONE-BUND
Amphetamines, Urine: NEGATIVE ng/mL
BENZODIAZ UR QL: NEGATIVE ng/mL
Barbiturate screen, urine: NEGATIVE ng/mL
Cocaine (Metab.): NEGATIVE ng/mL
OPIATE SCREEN URINE: NEGATIVE ng/mL
Oxycodone/Oxymorphone, Urine: NEGATIVE ng/mL
PCP Quant, Ur: NEGATIVE ng/mL

## 2019-11-10 LAB — CANNABINOID CONFIRMATION, UR
CANNABINOIDS: POSITIVE — AB
Carboxy THC GC/MS Conf: 144 ng/mL

## 2019-11-11 ENCOUNTER — Encounter: Payer: Self-pay | Admitting: Obstetrics and Gynecology

## 2019-11-11 ENCOUNTER — Observation Stay
Admission: RE | Admit: 2019-11-11 | Discharge: 2019-11-11 | Disposition: A | Discharge status: Home / Self Care | Payer: Medicaid Other | Attending: Obstetrics and Gynecology | Admitting: Obstetrics and Gynecology

## 2019-11-11 ENCOUNTER — Ambulatory Visit: Payer: Medicaid Other | Admitting: Advanced Practice Midwife

## 2019-11-11 ENCOUNTER — Ambulatory Visit: Payer: Self-pay | Admitting: *Deleted

## 2019-11-11 ENCOUNTER — Other Ambulatory Visit: Payer: Self-pay

## 2019-11-11 VITALS — BP 127/72 | HR 82 | Temp 96.9°F | Wt 160.4 lb

## 2019-11-11 DIAGNOSIS — O99012 Anemia complicating pregnancy, second trimester: Secondary | ICD-10-CM

## 2019-11-11 DIAGNOSIS — O360131 Maternal care for anti-D [Rh] antibodies, third trimester, fetus 1: Secondary | ICD-10-CM | POA: Insufficient documentation

## 2019-11-11 DIAGNOSIS — O99013 Anemia complicating pregnancy, third trimester: Secondary | ICD-10-CM | POA: Diagnosis not present

## 2019-11-11 DIAGNOSIS — O26853 Spotting complicating pregnancy, third trimester: Secondary | ICD-10-CM | POA: Diagnosis not present

## 2019-11-11 DIAGNOSIS — F909 Attention-deficit hyperactivity disorder, unspecified type: Secondary | ICD-10-CM | POA: Insufficient documentation

## 2019-11-11 DIAGNOSIS — F329 Major depressive disorder, single episode, unspecified: Secondary | ICD-10-CM | POA: Insufficient documentation

## 2019-11-11 DIAGNOSIS — F411 Generalized anxiety disorder: Secondary | ICD-10-CM | POA: Insufficient documentation

## 2019-11-11 DIAGNOSIS — F32A Depression, unspecified: Secondary | ICD-10-CM

## 2019-11-11 DIAGNOSIS — D649 Anemia, unspecified: Secondary | ICD-10-CM | POA: Diagnosis not present

## 2019-11-11 DIAGNOSIS — O99810 Abnormal glucose complicating pregnancy: Secondary | ICD-10-CM | POA: Insufficient documentation

## 2019-11-11 DIAGNOSIS — F1290 Cannabis use, unspecified, uncomplicated: Secondary | ICD-10-CM

## 2019-11-11 DIAGNOSIS — Z6791 Unspecified blood type, Rh negative: Secondary | ICD-10-CM | POA: Insufficient documentation

## 2019-11-11 DIAGNOSIS — O99323 Drug use complicating pregnancy, third trimester: Secondary | ICD-10-CM | POA: Diagnosis not present

## 2019-11-11 DIAGNOSIS — O99343 Other mental disorders complicating pregnancy, third trimester: Secondary | ICD-10-CM | POA: Insufficient documentation

## 2019-11-11 DIAGNOSIS — O99340 Other mental disorders complicating pregnancy, unspecified trimester: Secondary | ICD-10-CM

## 2019-11-11 DIAGNOSIS — O4693 Antepartum hemorrhage, unspecified, third trimester: Secondary | ICD-10-CM | POA: Diagnosis present

## 2019-11-11 DIAGNOSIS — Z3A35 35 weeks gestation of pregnancy: Secondary | ICD-10-CM | POA: Insufficient documentation

## 2019-11-11 DIAGNOSIS — Z3400 Encounter for supervision of normal first pregnancy, unspecified trimester: Secondary | ICD-10-CM

## 2019-11-11 LAB — WET PREP, GENITAL
Clue Cells Wet Prep HPF POC: NONE SEEN
Sperm: NONE SEEN
Trich, Wet Prep: NONE SEEN
Yeast Wet Prep HPF POC: NONE SEEN

## 2019-11-11 LAB — URINALYSIS, COMPLETE (UACMP) WITH MICROSCOPIC
Bilirubin Urine: NEGATIVE
Glucose, UA: NEGATIVE mg/dL
Hgb urine dipstick: NEGATIVE
Ketones, ur: NEGATIVE mg/dL
Nitrite: NEGATIVE
Protein, ur: NEGATIVE mg/dL
Specific Gravity, Urine: 1.028 (ref 1.005–1.030)
pH: 5 (ref 5.0–8.0)

## 2019-11-11 LAB — URINALYSIS
Bilirubin, UA: NEGATIVE
Glucose, UA: NEGATIVE
Ketones, UA: NEGATIVE
Leukocytes,UA: NEGATIVE
Nitrite, UA: NEGATIVE
RBC, UA: NEGATIVE
Specific Gravity, UA: 1.03 (ref 1.005–1.030)
Urobilinogen, Ur: 0.2 mg/dL (ref 0.2–1.0)
pH, UA: 5.5 (ref 5.0–7.5)

## 2019-11-11 LAB — CHLAMYDIA/NGC RT PCR (ARMC ONLY)
Chlamydia Tr: NOT DETECTED
N gonorrhoeae: NOT DETECTED

## 2019-11-11 MED ORDER — ACETAMINOPHEN 325 MG PO TABS: 650.0000 mg | ORAL_TABLET | ORAL | Status: DC | PRN

## 2019-11-11 MED ORDER — CALCIUM CARBONATE ANTACID 500 MG PO CHEW: 2.0000 | CHEWABLE_TABLET | ORAL | Status: DC | PRN

## 2019-11-11 NOTE — OB Triage Note (Signed)
Pt discharged home in stable condition. RN provided discharge instructions to pt, including on when to come back. Pt verbalized understanding and all questions answered at this time.

## 2019-11-11 NOTE — OB Triage Note (Signed)
Pt arrived in [place] with complaints of spotting. Pt states that she has been able to feel her baby move. Pt states the spotting on her underwear that she noted about an hour ago. The spotting was about a quarter size and dark brown. Pt states she has also been feeling dizzy the past couple days. Pt denies N/V and LOF. Monitors applied and assessing. Initial FHT 135.

## 2019-11-11 NOTE — Progress Notes (Signed)
Here today for 35.3 week MH RV. Taking PNV and Iron QD. Denies ED/hospital visits since last RV. Tawny Hopping, RN

## 2019-11-11 NOTE — Progress Notes (Signed)
   PRENATAL VISIT NOTE  Subjective:  Jamie Dodson is a 19 y.o. G1P0000 at [redacted]w[redacted]d being seen today for ongoing prenatal care.  She is currently monitored for the following issues for this low-risk pregnancy and has Panic disorder with agoraphobia; Fever blister; Benzodiazepine abuse (HCC); Supervision of normal first pregnancy, antepartum; Marijuana use; +UDS 05/26/19 MJ; +UDS 08/18/19 MJ; +UDS 10/07/19 MJ; +UDS MJ 10/21/19; +UDS MJ 11/04/19; Rh negative status during pregnancy in first trimester; GAD (generalized anxiety disorder); Anemia affecting pregnancy in second trimester; Abnormal glucose tolerance test (GTT) during pregnancy, antepartum; and Depression affecting pregnancy on their problem list.  Patient reports no complaints.  Contractions: Not present. Vag. Bleeding: None.  Movement: Present. Denies leaking of fluid/ROM.   The following portions of the patient's history were reviewed and updated as appropriate: allergies, current medications, past family history, past medical history, past social history, past surgical history and problem list. Problem list updated.  Objective:   Vitals:   11/11/19 0900  BP: 127/72  Pulse: 82  Temp: (!) 96.9 F (36.1 C)  Weight: 160 lb 6.4 oz (72.8 kg)    Fetal Status: Fetal Heart Rate (bpm): 120 Fundal Height: 36 cm Movement: Present  Presentation: Vertex  General:  Alert, oriented and cooperative. Patient is in no acute distress.  Skin: Skin is warm and dry. No rash noted.   Cardiovascular: Normal heart rate noted  Respiratory: Normal respiratory effort, no problems with respiration noted  Abdomen: Soft, gravid, appropriate for gestational age.  Pain/Pressure: Absent     Pelvic: Cervical exam deferred        Extremities: Normal range of motion.  Edema: None  Mental Status: Normal mood and affect. Normal behavior. Normal judgment and thought content.   Assessment and Plan:  Pregnancy: G1P0000 at [redacted]w[redacted]d  1. Supervision of normal first pregnancy,  antepartum Has 3 car seats.  Living with fiance. States diarrhea and u/c's resolved BP creeping up--u/a today - Urinalysis (Urine Dip)  2. Anemia affecting pregnancy in second trimester Taking FeSo4 daily  3. Depression affecting pregnancy Taking Zoloft 75 mg daily Called Kathreen Cosier for counseling but she is on vacation--pt states will call her on 11/16/19 for appt  4. Marijuana use; +UDS 05/26/19 MJ; +UDS 08/18/19 MJ; +UDS 10/07/19 MJ; +UDS MJ 10/21/19; +UDS MJ 11/04/19 Pt states last use 10/31/19.   Preterm labor symptoms and general obstetric precautions including but not limited to vaginal bleeding, contractions, leaking of fluid and fetal movement were reviewed in detail with the patient. Please refer to After Visit Summary for other counseling recommendations.  Return in about 1 week (around 11/18/2019) for routine PNC.  No future appointments.  Alberteen Spindle, CNM

## 2019-11-11 NOTE — Progress Notes (Signed)
Urine dip reviewed by provider, no new orders. Patient counseled to schedule appointment for 1 week. Patient counseled, per provider, that she has trace protein in urine, and that we will likely check her urine next week and watch her BP. Patient states understanding.Burt Knack, RN

## 2019-11-11 NOTE — Discharge Summary (Signed)
Jamie Dodson is a 19 y.o. female. She is at [redacted]w[redacted]d gestation. Patient's last menstrual period was 03/08/2019 (exact date). Estimated Date of Delivery: 12/13/19  Prenatal care site: [facility]   Current pregnancy complicated by:  1. Substance use: MJ  2. Depression, generalized anxiety d/o; panic d/o, on Zoloft 75mg  3. RH negative 4. Anemia 5. Abnormal GTT, normal 3hr GTT (one abnormal value)   Chief complaint: spotting started today, light brownish red discharge, approx size of a quarter on panties. No recent IC, has more frequent UCs at night, mildly crampy but not painful.    S: Resting comfortably. no CTX, no VB.no LOF,  Active fetal movement. Denies: HA, visual changes, SOB, or RUQ/epigastric pain  Maternal Medical History:   Past Medical History:  Diagnosis Date  . ADHD (attention deficit hyperactivity disorder)   . Anemia affecting pregnancy in second trimester 09/22/2019  . Anxiety    Hx anxiety/panic feelings-no med currently  . 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   ER note 03/08/2018  . 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  . Ureteral reflux     Past Surgical History:  Procedure Laterality Date  . kidney surgery    . SKIN GRAFT     Hand    No Known Allergies  Prior to Admission medications   Medication Sig Start Date End Date Taking? Authorizing Provider  ferrous sulfate 324 (65 Fe) MG TBEC Take 1 tablet (325 mg total) by mouth daily. 09/22/19  Yes 11/23/19, MD  Prenatal Vit-Fe Fumarate-FA (MULTIVITAMIN-PRENATAL) 27-0.8 MG TABS tablet Take 1 tablet by mouth daily at 12 noon.   Yes [provider]  sertraline (ZOLOFT) 25 MG tablet TAKE 3 TABLETS BY MOUTH DAILY Patient taking differently: 75 mg daily.  10/06/19 01/04/20 Yes Staples, 01/06/20, PA-C      Social History: She  reports that she has quit smoking. She has never used smokeless tobacco. She reports previous alcohol use. She reports previous drug use. Drug: Marijuana.  Family History:  family history includes Diabetes in her maternal grandmother; Hypertension in her maternal grandfather and maternal grandmother.   Review of Systems: A full review of systems was performed and negative except as noted in the HPI.    O:  BP 123/79 (BP Location: Left Arm)   Pulse 100   Temp 98.2 F (36.8 C) (Oral)   Resp 16   Ht 5\' 3"  (1.6 m)   Wt 72.8 kg   LMP 03/08/2019 (Exact Date)   BMI 28.41 kg/m  Results for orders placed or performed during the hospital encounter of 11/11/19 (from the past 48 hour(s))  Urinalysis, Complete w Microscopic Urine, Clean Catch   Collection Time: 11/11/19  7:12 PM  Result Value Ref Range   Color, Urine YELLOW (A) YELLOW   APPearance CLOUDY (A) CLEAR   Specific Gravity, Urine 1.028 1.005 - 1.030   pH 5.0 5.0 - 8.0   Glucose, UA NEGATIVE NEGATIVE mg/dL   Hgb urine dipstick NEGATIVE NEGATIVE   Bilirubin Urine NEGATIVE NEGATIVE   Ketones, ur NEGATIVE NEGATIVE mg/dL   Protein, ur NEGATIVE NEGATIVE mg/dL   Nitrite NEGATIVE NEGATIVE   Leukocytes,Ua TRACE (A) NEGATIVE   RBC / HPF 0-5 0 - 5 RBC/hpf   WBC, UA 6-10 0 - 5 WBC/hpf   Bacteria, UA RARE (A) NONE SEEN   Squamous Epithelial / LPF 11-20 0 - 5   Mucus PRESENT   Wet prep, genital   Collection Time: 11/11/19  7:48 PM   Specimen: Vaginal; Genital  Result Value Ref Range   Yeast Wet Prep HPF POC NONE SEEN NONE SEEN   Trich, Wet Prep NONE SEEN NONE SEEN   Clue Cells Wet Prep HPF POC NONE SEEN NONE SEEN   WBC, Wet Prep HPF POC RARE (A) NONE SEEN   Sperm NONE SEEN   Results for orders placed or performed in visit on 11/11/19 (from the past 48 hour(s))  Urinalysis (Urine Dip)   Collection Time: 11/11/19  9:35 AM  Result Value Ref Range   Specific Gravity, UA 1.030 1.005 - 1.030   pH, UA 5.5 5.0 - 7.5   Color, UA Yellow Yellow   Appearance Ur Clear Clear   Leukocytes,UA Negative Negative   Protein,UA Trace Negative/Trace   Glucose, UA Negative Negative   Ketones, UA Negative Negative   RBC,  UA Negative Negative   Bilirubin, UA Negative Negative   Urobilinogen, Ur 0.2 0.2 - 1.0 mg/dL   Nitrite, UA Negative Negative     Constitutional: NAD, AAOx3  HE/ENT: extraocular movements grossly intact, moist mucous membranes CV: RRR PULM: nl respiratory effort, CTABL     Abd: gravid, non-tender, non-distended, soft      Ext: Non-tender, Nonedematous   Psych: mood appropriate, speech normal Pelvic:normal appearing cervical mucus but slightly friable. No active VB noted. White DC noted, pending Wet prep and GC/CT.   Fetal  monitoring: Cat I Appropriate for GA Baseline:  Variability: moderate Accelerations: absent/ present x >2 Decelerations absent Time    A/P: 19 y.o. [redacted]w[redacted]d here for antenatal surveillance for vaginal spotting.   Principle Diagnosis:  35wks, spotting, vaginal discharge   Preterm labor: not present.   Fetal Wellbeing: Reassuring Cat 1 tracing with reactive NST   UA with no signs of infection, encouraged increased PO hydration.   Wet prep negative, GC/CT pending  D/c home stable, precautions reviewed, follow-up as scheduled.    Randa Ngo, CNM 11/11/2019  8:42 PM

## 2019-11-11 NOTE — Telephone Encounter (Signed)
  C/o brownish mucus discharge a few minutes ago. [redacted] weeks pregnant. Reports feeling lightheaded and some SOB at times when standing for a couple of days. Feels like possible contractions at night and patient will lay on side. Denies abdominal pain. Does feel movement of baby. Instructed patient to go to LD. Care advise given. Patient verbalized understanding of care advise.   Reason for Disposition . [1] Pregnant 24-36 weeks (preterm) AND [2] pinkish or brownish mucous discharge  Answer Assessment - Initial Assessment Questions 1. ONSET: "When did this bleeding start?"        Few minutes 2. DESCRIPTION: "Describe the bleeding that you are having." "How much bleeding is there?"    - SPOTTING: spotting, or pinkish / brownish mucous discharge; does not fill panti-liner or pad    - MILD:  less than 1 pad / hour; less than patient's usual menstrual bleeding   - MODERATE: 1-2 pads / hour; 1 menstrual cup every 6 hours; small-medium blood clots (e.g., pea, grape, small coin)   - SEVERE: soaking 2 or more pads/hour for 2 or more hours; 1 menstrual cup every 2 hours; bleeding not contained by pads or continuous red blood from vagina; large blood clots (e.g., golf ball, large coin)      Spotting brown mucus  3. ABDOMINAL PAIN SEVERITY: If present, ask: "How bad is it?"  (e.g., Scale 1-10; mild, moderate, or severe)   - MILD (1-3): doesn't interfere with normal activities, abdomen soft and not tender to touch    - MODERATE (4-7): interferes with normal activities or awakens from sleep, tender to touch    - SEVERE (8-10): excruciating pain, doubled over, unable to do any normal activities     none 4. PREGNANCY: "Do you know how many weeks or months pregnant you are?"      35 weeks 5. EDD: "What date are you expecting to deliver?"     September 27 6. FETAL MOVEMENT: "Has the baby's movement decreased or changed significantly from normal?"     Same  7. HEMODYNAMIC STATUS: "Are you weak or feeling  lightheaded?" If Yes, ask: "Can you stand and walk normally?"      Lightheadedness at times for a couple of days with SOB 8. OTHER SYMPTOMS: "What other symptoms are you having with the bleeding?" (e.g., leaking fluid from vagina, contractions)     Some pain like contractions at night  Protocols used: PREGNANCY - VAGINAL BLEEDING GREATER THAN [redacted] WEEKS EGA-A-AH

## 2019-11-11 NOTE — Discharge Instructions (Signed)
LABOR: When contractions begin, you should start to time them from the beginning of one contraction to the beginning of the next.  When contractions are 5-10 minutes apart or less and have been regular for at least an hour, you should call your health care provider.  Notify your doctor if any of the following occur: 1. Bleeding from the vagina 7. Sudden, constant, or occasional abdominal pain  2. Pain or burning when urinating 8. Sudden gushing of fluid from the vagina (with or without continued leaking)  3. Chills or fever 9. Fainting spells, "black outs" or loss of consciousness  4. Increase in vaginal discharge 10. Severe or continued nausea or vomiting  5. Pelvic pressure (sudden increase) 11. Blurring of vision or spots before the eyes  6. Baby moving less than usual 12. Leaking of fluid    FETAL KICK COUNT: Lie on your left side for one hour after a meal, and count the number of times your baby kicks. If it is less than 5 times, get up, move around and drink some juice. Repeat the test 30 minutes later. If it is still less than 5 kicks in an hour, notify your doctor.  **Remember to drink water and stay hydrated!

## 2019-11-12 LAB — URINE CULTURE: Culture: <10000 — AB

## 2019-11-16 ENCOUNTER — Telehealth: Payer: Self-pay | Admitting: Licensed Clinical Social Worker

## 2019-11-16 NOTE — Telephone Encounter (Signed)
LCSW returned patient's call requesting appt. - appointment was scheduled.

## 2019-11-18 ENCOUNTER — Other Ambulatory Visit: Payer: Self-pay

## 2019-11-18 ENCOUNTER — Ambulatory Visit: Payer: Medicaid Other | Admitting: Advanced Practice Midwife

## 2019-11-18 VITALS — BP 120/74 | HR 81 | Temp 97.0°F | Wt 161.6 lb

## 2019-11-18 DIAGNOSIS — O99012 Anemia complicating pregnancy, second trimester: Secondary | ICD-10-CM

## 2019-11-18 DIAGNOSIS — O99340 Other mental disorders complicating pregnancy, unspecified trimester: Secondary | ICD-10-CM

## 2019-11-18 DIAGNOSIS — N760 Acute vaginitis: Secondary | ICD-10-CM

## 2019-11-18 DIAGNOSIS — F1290 Cannabis use, unspecified, uncomplicated: Secondary | ICD-10-CM

## 2019-11-18 DIAGNOSIS — Z3400 Encounter for supervision of normal first pregnancy, unspecified trimester: Secondary | ICD-10-CM

## 2019-11-18 DIAGNOSIS — B9689 Other specified bacterial agents as the cause of diseases classified elsewhere: Secondary | ICD-10-CM

## 2019-11-18 DIAGNOSIS — F329 Major depressive disorder, single episode, unspecified: Secondary | ICD-10-CM

## 2019-11-18 LAB — HEMOGLOBIN, FINGERSTICK: Hemoglobin: 10.7 g/dL — ABNORMAL LOW (ref 11.1–15.9)

## 2019-11-18 LAB — OB RESULTS CONSOLE GC/CHLAMYDIA: Chlamydia: NEGATIVE

## 2019-11-18 LAB — WET PREP FOR TRICH, YEAST, CLUE
Trichomonas Exam: NEGATIVE
Yeast Exam: NEGATIVE

## 2019-11-18 MED ORDER — METRONIDAZOLE 250 MG PO TABS: 250.0000 mg | ORAL_TABLET | Freq: Three times a day (TID) | ORAL | 0 refills | Status: AC

## 2019-11-18 NOTE — Progress Notes (Signed)
Patient treated for BV per provider orders, counseled to take iron supplement 2x daily per provider orders. Patient states understanding. Burt Knack, RN

## 2019-11-18 NOTE — Progress Notes (Signed)
In for MH RV at 36.3 weeks. GBS and C&G collected. Takes PNV daily. Seen in ED on 8/25 for bloody vaginal discharge. Sharlyne Pacas, RN

## 2019-11-18 NOTE — Progress Notes (Signed)
   PRENATAL VISIT NOTE  Subjective:  Jamie Dodson is a 19 y.o. G1P0000 at [redacted]w[redacted]d being seen today for ongoing prenatal care.  She is currently monitored for the following issues for this low-risk pregnancy and has Panic disorder with agoraphobia; Fever blister; Benzodiazepine abuse (HCC); Supervision of normal first pregnancy, antepartum; Marijuana use; +UDS 05/26/19 MJ; +UDS 08/18/19 MJ; +UDS 10/07/19 MJ; +UDS MJ 10/21/19; +UDS MJ 11/04/19; Rh negative status during pregnancy in first trimester; GAD (generalized anxiety disorder); Anemia affecting pregnancy in second trimester; Abnormal glucose tolerance test (GTT) during pregnancy, antepartum; Depression affecting pregnancy; and Vaginal bleeding in pregnancy, third trimester on their problem list.  Patient reports no complaints.  Contractions: Not present. Vag. Bleeding: None.  Movement: Present. Denies leaking of fluid/ROM.   The following portions of the patient's history were reviewed and updated as appropriate: allergies, current medications, past family history, past medical history, past social history, past surgical history and problem list. Problem list updated.  Objective:   Vitals:   11/18/19 0844  BP: 120/74  Pulse: 81  Temp: (!) 97 F (36.1 C)  Weight: 161 lb 9.6 oz (73.3 kg)    Fetal Status: Fetal Heart Rate (bpm): 120 Fundal Height: 36 cm Movement: Present  Presentation: Vertex  General:  Alert, oriented and cooperative. Patient is in no acute distress.  Skin: Skin is warm and dry. No rash noted.   Cardiovascular: Normal heart rate noted  Respiratory: Normal respiratory effort, no problems with respiration noted  Abdomen: Soft, gravid, appropriate for gestational age.  Pain/Pressure: Absent     Pelvic: Cervical exam performed Dilation: Fingertip Effacement (%): 70 Station: Ballotable  Extremities: Normal range of motion.  Edema: None  Mental Status: Normal mood and affect. Normal behavior. Normal judgment and thought content.    Assessment and Plan:  Pregnancy: G1P0000 at [redacted]w[redacted]d  1. Anemia affecting pregnancy in second trimester Taking FeSo4 I daily with cranberry juice  2. Marijuana use; +UDS 05/26/19 MJ; +UDS 08/18/19 MJ; +UDS 10/07/19 MJ; +UDS MJ 10/21/19; +UDS MJ 11/04/19 States last use 2 wks ago (11/08/19); not using CBD gummies anymore  3. Depression affecting pregnancy PHQ-9=4.  Taking Zoloft since 10/06/19 and now at 75 mg.  Has appt with [facility] 11/24/19  4. Supervision of normal first pregnancy, antepartum Knows when to go to L&D.  Living with fiance.  Ready for baby at home.  No more spotting GBS, wet mount, GC/Chlamydia cultures done today - WET PREP FOR TRICH, YEAST, CLUE - Chlamydia/GC NAA, Confirmation - Hemoglobin, venipuncture - Culture, beta strep (group b only)   Preterm labor symptoms and general obstetric precautions including but not limited to vaginal bleeding, contractions, leaking of fluid and fetal movement were reviewed in detail with the patient. Please refer to After Visit Summary for other counseling recommendations.  Return in about 1 week (around 11/25/2019) for routine PNC.  Future Appointments  Date Time Provider Department Center  11/24/2019  3:20 PM Kathreen Cosier, LCSW [facility] None    Alberteen Spindle, CNM

## 2019-11-21 LAB — CHLAMYDIA/GC NAA, CONFIRMATION
Chlamydia trachomatis, NAA: NEGATIVE
Neisseria gonorrhoeae, NAA: NEGATIVE

## 2019-11-22 LAB — CULTURE, BETA STREP (GROUP B ONLY): Strep Gp B Culture: NEGATIVE

## 2019-11-24 ENCOUNTER — Encounter: Payer: Self-pay | Admitting: Licensed Clinical Social Worker

## 2019-11-24 ENCOUNTER — Ambulatory Visit: Payer: Medicaid Other | Admitting: Licensed Clinical Social Worker

## 2019-11-24 DIAGNOSIS — F4010 Social phobia, unspecified: Secondary | ICD-10-CM

## 2019-11-24 DIAGNOSIS — F411 Generalized anxiety disorder: Secondary | ICD-10-CM

## 2019-11-24 NOTE — Progress Notes (Signed)
Counselor Initial Adult Exam  Name: Jamie Dodson Date: 11/24/2019 MRN: [id number] DOB: [birth year] PCP: Kerman Passey, MD  Time spent: 1 hour   A biopsychosocial was completed on the Patient. Background information and current concerns were obtained during an intake on Zoom with the [facility] Department clinician, Leanna Battles, LCSW.  Contact information and confidentiality was discussed and appropriate consents were signed.     Reason for Visit /Presenting Problem: Patient reports concerns of anxiety. She reports that she has benefited from Zoloft but continues to experience symptoms of generalized anxiety disorder (GAD-7 = 15) and social anxiety. She reports feeling anxious, nervous about social situations, fear of judgement in social situations, avoidance and procrastination of social situation and  stuttering her words when she has to speak in a social setting. Patient reports that her anxiety and social anxiety developed in high school and she believes that it contributed to her dropping out in 10th grade. Patient denies any significant childhood trauma or incidents but does report that her parents separated for 2 years when she was 19yo. She reports that this was a difficult time for her and her family. Patient reports having a good support system, including a close relationship with her parents, her brother, one best friend and with her finance whom she has been dating for 21 years.   Mental Status Exam:   Appearance:   Casual     Behavior:  Appropriate and Sharing  Motor:  Normal  Speech/Language:   Normal Rate  Affect:  Appropriate  Mood:  normal  Thought process:  normal  Thought content:    WNL  Sensory/Perceptual disturbances:    WNL  Orientation:  oriented to person, place, time/date, situation, day of week and month of year  Attention:  Good  Concentration:  Good  Memory:  WNL  Fund of knowledge:   Good  Insight:    Good  Judgment:   Good  Impulse Control:  Good    Reported Symptoms:  Sleep disturbance and anxiety, anxious thoughts, irritability   Risk Assessment: Danger to Self:  No Self-injurious Behavior: No Danger to Others: No Duty to Warn:no Physical Aggression / Violence:No  Access to Firearms a concern: No  Gang Involvement:No  Patient / guardian was educated about steps to take if suicide or homicide risk level increases between visits: yes While future psychiatric events cannot be accurately predicted, the patient does not currently require acute inpatient psychiatric care and does not currently meet [facility] involuntary commitment criteria.  Substance Abuse History: Current substance abuse: Used MJ in the past     Past Psychiatric History:   Previous psychological history is significant for anxiety ; family history of depression and anxiety  Outpatient Providers: NA History of Psych Hospitalization: No   Abuse History: Victim of No., NA   Report needed: No. Victim of Neglect:No. Perpetrator of NA  Witness / Exposure to Domestic Violence: No   Protective Services Involvement: No  Witness to [employer] Violence:  No   Family History:  Family History  Problem Relation Age of Onset  . Anxiety disorder Mother   . Depression Mother   . Diabetes Maternal Grandmother   . Hypertension Maternal Grandmother   . Hypertension Maternal Grandfather     Social History:  Social History   Socioeconomic History  . Marital status: Single    Spouse name: Fransico Michael  . Number of children: 0  . Years of education: 10  . Highest education level: 10th grade  Occupational History  . Not on file  Tobacco Use  . Smoking status: Former Games developer  . Smokeless tobacco: Never Used  Vaping Use  . Vaping Use: Former  Substance and Sexual Activity  . Alcohol use: Not Currently    Alcohol/week: 0.0 standard drinks    Comment: reports drank 1/3 of 1/5[employer] Morgan last night  . Drug use: Not Currently    Types: Marijuana    Comment: Occ.  use before known pregnant  . Sexual activity: Yes    Birth control/protection: Condom, Pill    Comment: Last O.C.'s-3 yrs. ago  Other Topics Concern  . Not on file  Social History Narrative   Patient lives with her finance and reports a supportive relationship of 3 years. She reports having one best friend and having a close relationship with her parents. She is currently unemployed and going to school online to obtain her high school diploma.    Social Determinants of Health   Financial Resource Strain:   . Difficulty of Paying Living Expenses: Not on file  Food Insecurity: No Food Insecurity  . Worried About Programme researcher, broadcasting/film/video in the Last Year: Never true  . Ran Out of Food in the Last Year: Never true  Transportation Needs: No Transportation Needs  . Lack of Transportation (Medical): No  . Lack of Transportation (Non-Medical): No  Physical Activity:   . Days of Exercise per Week: Not on file  . Minutes of Exercise per Session: Not on file  Stress:   . Feeling of Stress : Not on file  Social Connections:   . Frequency of Communication with Friends and Family: Not on file  . Frequency of Social Gatherings with Friends and Family: Not on file  . Attends Religious Services: Not on file  . Active Member of Clubs or Organizations: Not on file  . Attends Banker Meetings: Not on file  . Marital Status: Not on file    Living situation: the patient and her fiance live together. Been together 3 years.   Sexual Orientation:  Straight  Relationship Status: co-habitating  Name of spouse / other: NA             If a parent, number of children / ages: currently pregnant due 12/14/2019.   Support Systems; significant other friends parents Siblings And one best friend   Surveyor, quantity Stress:  No   Income/Employment/Disability: supported by Clinical research associate: No   Educational History: Education: student working on it currently   Religion/Sprituality/World  View:   Christian   Any cultural differences that may affect / interfere with treatment:  not applicable   Recreation/Hobbies: paint  Stressors:Other: worrying about school and the baby coming   Strengths:  Supportive Relationships  Barriers:  NA   Legal History: Pending legal issue / charges: The patient has no significant history of legal issues. History of legal issue / charges: NA  Medical History/Surgical History:reviewed Past Medical History:  Diagnosis Date  . ADHD (attention deficit hyperactivity disorder)    patient denies this dx.   . Anemia affecting pregnancy in second trimester 09/22/2019  . Anxiety    Hx anxiety/panic feelings-no med currently  . 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   ER note 03/08/2018  . 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  . Ureteral reflux     Past Surgical History:  Procedure Laterality Date  . kidney surgery    . SKIN GRAFT     Hand    Medications: Current Outpatient  Medications  Medication Sig Dispense Refill  . ferrous sulfate 324 (65 Fe) MG TBEC Take 1 tablet (325 mg total) by mouth daily. 100 tablet 0  . metroNIDAZOLE (FLAGYL) 250 MG tablet Take 1 tablet (250 mg total) by mouth 3 (three) times daily for 7 days. 21 tablet 0  . Prenatal Vit-Fe Fumarate-FA (MULTIVITAMIN-PRENATAL) 27-0.8 MG TABS tablet Take 1 tablet by mouth daily at 12 noon.    . sertraline (ZOLOFT) 25 MG tablet TAKE 3 TABLETS BY MOUTH DAILY (Patient taking differently: 75 mg daily. ) 270 tablet 2   No current facility-administered medications for this visit.   No Known Allergies  Pristine A Liuzzi is a 19 y.o. year old female  with a reported history of general anxiety disorder, panic disorder with agoraphobia, and depression diagnoses. Patient currently presents with anxiety symptoms that have been chronic since high school.  Patient currently describes both general anxiety disorder and social anxiety disorder. She endorsees symptoms of generalized anxiety  disorder - she reports these symptoms have decreased since beginning Zoloft but continues to report sleep disturbance, irritability, difficulties controlling worries, edginess and restlessness. (GAD-7 = 15). Patient also describes symptoms of social anxiety, including feeling anxious, fearful, and nervous about social situations, fear of judgement in social situations, and avoidance and procrastination of social situations. Patient denies any depressive symptoms. Patient reports that these symptoms significantly impact her functioning in multiple life domains.   Due to the above symptoms and patient's reported history, patient is diagnosed with Generalized Anxiety Disorder and Social Anxiety Disorder. Continued mental health treatment is needed to address patient's symptoms and monitor her safety and stability. Patient is recommended for continued psychiatric medication management and continued outpatient therapy to further reduce her symptoms and improve her coping strategies.    There is no acute risk for suicide or violence at this time.  While future psychiatric events cannot be accurately predicted, the patient does not require acute inpatient psychiatric care and does not currently meet [facility] involuntary commitment criteria.   Diagnoses:    ICD-10-CM   1. Generalized anxiety disorder  F41.1   2. Social anxiety disorder  F40.10     Plan of Care: Patient's goal of treatment is to have reassurance and to be more accepting of mental health symptoms.  -Provided brief psychoeducation on CBTs. -Patient and LCSW agreed to develop treatment plan at next session.  -LCSW encouraged patient to continue to discuss anxiety symptoms with provider.     Future Appointments  Date Time Provider Department Center  11/25/2019  9:00 AM [facility] PROVIDER [facility] None  12/07/2019  3:00 PM Kathreen Cosier, LCSW [facility] None   Interpreter used:  NA   Kathreen Cosier, LCSW

## 2019-11-25 ENCOUNTER — Ambulatory Visit: Payer: Medicaid Other | Admitting: Advanced Practice Midwife

## 2019-11-25 ENCOUNTER — Other Ambulatory Visit: Payer: Self-pay

## 2019-11-25 VITALS — BP 118/74 | HR 85 | Temp 97.8°F | Wt 164.0 lb

## 2019-11-25 DIAGNOSIS — O99012 Anemia complicating pregnancy, second trimester: Secondary | ICD-10-CM

## 2019-11-25 DIAGNOSIS — Z3400 Encounter for supervision of normal first pregnancy, unspecified trimester: Secondary | ICD-10-CM

## 2019-11-25 DIAGNOSIS — F1290 Cannabis use, unspecified, uncomplicated: Secondary | ICD-10-CM

## 2019-11-25 DIAGNOSIS — F411 Generalized anxiety disorder: Secondary | ICD-10-CM

## 2019-11-25 NOTE — Progress Notes (Signed)
Pt denies visits to ER since last appt with [facility]. Pt taking PNV, iron, zoloft. Pt states she was unable to take all of her Metronidazole, only took 2-3 pills, they made her vomit even though taking with food.  Pt is unsure of what her contraception will be after giving birth.

## 2019-11-25 NOTE — Progress Notes (Signed)
   PRENATAL VISIT NOTE  Subjective:  Jamie Dodson is a 19 y.o. G1P0000 at [redacted]w[redacted]d being seen today for ongoing prenatal care.  She is currently monitored for the following issues for this low-risk pregnancy and has Panic disorder with agoraphobia; Fever blister; Benzodiazepine abuse ([place]); Supervision of normal first pregnancy, antepartum; Marijuana use; +UDS 05/26/19 MJ; +UDS 08/18/19 MJ; +UDS 10/07/19 MJ; +UDS MJ 10/21/19; +UDS MJ 11/04/19; Rh negative status during pregnancy in first trimester; GAD (generalized anxiety disorder); Anemia affecting pregnancy in second trimester; Abnormal glucose tolerance test (GTT) during pregnancy, antepartum; Depression affecting pregnancy; and Vaginal bleeding in pregnancy, third trimester on their problem list.  Patient reports no complaints.  Contractions: Not present. Vag. Bleeding: None.  Movement: Present. Denies leaking of fluid/ROM.   The following portions of the patient's history were reviewed and updated as appropriate: allergies, current medications, past family history, past medical history, past social history, past surgical history and problem list. Problem list updated.  Objective:   Vitals:   11/25/19 0910  BP: 118/74  Pulse: 85  Temp: 97.8 F (36.6 C)  Weight: 164 lb (74.4 kg)    Fetal Status: Fetal Heart Rate (bpm): 130 Fundal Height: 36 cm Movement: Present  Presentation: Vertex  General:  Alert, oriented and cooperative. Patient is in no acute distress.  Skin: Skin is warm and dry. No rash noted.   Cardiovascular: Normal heart rate noted  Respiratory: Normal respiratory effort, no problems with respiration noted  Abdomen: Soft, gravid, appropriate for gestational age.  Pain/Pressure: Absent     Pelvic: Cervical exam deferred        Extremities: Normal range of motion.  Edema: None  Mental Status: Normal mood and affect. Normal behavior. Normal judgment and thought content.   Assessment and Plan:  Pregnancy: G1P0000 at [redacted]w[redacted]d  1.  Anemia affecting pregnancy in second trimester Taking FeSo4 I daily with juice  2. Supervision of normal first pregnancy, antepartum Walks 3x/wk x 20 min Didn't finish all Metronidazole for BV; only took 2-3 pills due to N&V Still looking for Peds (all not accepting new pts)  3. Marijuana use; +UDS 05/26/19 MJ; +UDS 08/18/19 MJ; +UDS 10/07/19 MJ; +UDS MJ 10/21/19; +UDS MJ 11/04/19 States last use mid August - [id number] Drug Screen  4. GAD (generalized anxiety disorder) Met with Milton Ferguson for first time yesterday and dx'd with generalized anxiety D/O and social anxiety To try taking Zoloft earlier in am as anxiety worse in am.  Taking 75 mg daily. -cry, sleep good, apppetite good, -anhedonia, moody, irritable, easily angered 2-3x/wk   Preterm labor symptoms and general obstetric precautions including but not limited to vaginal bleeding, contractions, leaking of fluid and fetal movement were reviewed in detail with the patient. Please refer to After Visit Summary for other counseling recommendations.  Return in about 1 week (around 12/02/2019) for routine PNC.  Future Appointments  Date Time Provider [place]  12/07/2019  3:00 PM Milton Ferguson, LCSW [facility] None    Herbie Saxon, CNM

## 2019-11-26 LAB — 789231 7+OXYCODONE-BUND
Amphetamines, Urine: NEGATIVE ng/mL
BENZODIAZ UR QL: NEGATIVE ng/mL
Barbiturate screen, urine: NEGATIVE ng/mL
Cannabinoid Quant, Ur: NEGATIVE ng/mL
Cocaine (Metab.): NEGATIVE ng/mL
OPIATE SCREEN URINE: NEGATIVE ng/mL
Oxycodone/Oxymorphone, Urine: NEGATIVE ng/mL
PCP Quant, Ur: NEGATIVE ng/mL

## 2019-12-02 ENCOUNTER — Ambulatory Visit: Payer: Medicaid Other | Admitting: Advanced Practice Midwife

## 2019-12-02 ENCOUNTER — Other Ambulatory Visit: Payer: Self-pay

## 2019-12-02 VITALS — BP 124/88 | HR 84 | Temp 97.3°F | Wt 165.4 lb

## 2019-12-02 DIAGNOSIS — F1290 Cannabis use, unspecified, uncomplicated: Secondary | ICD-10-CM

## 2019-12-02 DIAGNOSIS — F329 Major depressive disorder, single episode, unspecified: Secondary | ICD-10-CM

## 2019-12-02 DIAGNOSIS — O99012 Anemia complicating pregnancy, second trimester: Secondary | ICD-10-CM

## 2019-12-02 DIAGNOSIS — O99340 Other mental disorders complicating pregnancy, unspecified trimester: Secondary | ICD-10-CM

## 2019-12-02 DIAGNOSIS — Z3400 Encounter for supervision of normal first pregnancy, unspecified trimester: Secondary | ICD-10-CM

## 2019-12-02 NOTE — Progress Notes (Signed)
   PRENATAL VISIT NOTE  Subjective:  Jamie Dodson is a 19 y.o. G1P0000 at [redacted]w[redacted]d being seen today for ongoing prenatal care.  She is currently monitored for the following issues for this low-risk pregnancy and has Panic disorder with agoraphobia; Fever blister; Benzodiazepine abuse (HCC); Supervision of normal first pregnancy, antepartum; Marijuana use; +UDS 05/26/19 MJ; +UDS 08/18/19 MJ; +UDS 10/07/19 MJ; +UDS MJ 10/21/19; +UDS MJ 11/04/19; Rh negative status during pregnancy in first trimester; GAD (generalized anxiety disorder); Anemia affecting pregnancy in second trimester; Abnormal glucose tolerance test (GTT) during pregnancy, antepartum; Depression affecting pregnancy; and Vaginal bleeding in pregnancy, third trimester on their problem list.  Patient reports no complaints.  Contractions: Not present. Vag. Bleeding: None.  Movement: Present. Denies leaking of fluid/ROM.   The following portions of the patient's history were reviewed and updated as appropriate: allergies, current medications, past family history, past medical history, past social history, past surgical history and problem list. Problem list updated.  Objective:   Vitals:   12/02/19 0958  BP: 124/88  Pulse: 84  Temp: (!) 97.3 F (36.3 C)  Weight: 165 lb 6.4 oz (75 kg)    Fetal Status: Fetal Heart Rate (bpm): 140 Fundal Height: 38 cm Movement: Present  Presentation: Vertex  General:  Alert, oriented and cooperative. Patient is in no acute distress.  Skin: Skin is warm and dry. No rash noted.   Cardiovascular: Normal heart rate noted  Respiratory: Normal respiratory effort, no problems with respiration noted  Abdomen: Soft, gravid, appropriate for gestational age.  Pain/Pressure: Absent     Pelvic: Cervical exam deferred        Extremities: Normal range of motion.  Edema: None  Mental Status: Normal mood and affect. Normal behavior. Normal judgment and thought content.   Assessment and Plan:  Pregnancy: G1P0000 at  [redacted]w[redacted]d  1. Anemia affecting pregnancy in second trimester   2. Marijuana use; +UDS 05/26/19 MJ; +UDS 08/18/19 MJ; +UDS 10/07/19 MJ; +UDS MJ 10/21/19; +UDS MJ 11/04/19 11/25/19 UDS neg  3. Depression affecting pregnancy Taking Zoloft 75 mg in am and less anxiety now Had first apt with Kathreen Cosier on 11/24/19; next appt is 12/10/19  4. Supervision of normal first pregnancy, antepartum Ready for baby at home.  Knows when to go to L&D.  Has car seat. IOL paperwork completed--pt desires 12/25/19 Encouraged Covid vaccine   Term labor symptoms and general obstetric precautions including but not limited to vaginal bleeding, contractions, leaking of fluid and fetal movement were reviewed in detail with the patient. Please refer to After Visit Summary for other counseling recommendations.  No follow-ups on file.  Future Appointments  Date Time Provider Department Center  12/07/2019  3:00 PM Kathreen Cosier, LCSW [facility] None    Alberteen Spindle, CNM

## 2019-12-03 ENCOUNTER — Telehealth: Payer: Self-pay

## 2019-12-03 NOTE — Telephone Encounter (Signed)
Received phone call from [facility] at [facility] informing RN that IOL paperwork has been received on patient but that per [facility] midwife Victorino Dike that patient is too early to schedule for an IOL. Per Yvonna Alanis patient will be scheduled around 12/22/2019 and will return a call to our clinic verifying date and that patient has been scheduled for IOL. Tawny Hopping, RN

## 2019-12-05 ENCOUNTER — Other Ambulatory Visit: Payer: Self-pay

## 2019-12-05 ENCOUNTER — Inpatient Hospital Stay: Payer: Medicaid Other | Admitting: Anesthesiology

## 2019-12-05 ENCOUNTER — Inpatient Hospital Stay
Admission: EM | Admit: 2019-12-05 | Discharge: 2019-12-07 | DRG: 806 | Disposition: A | Discharge status: Home / Self Care | Payer: Medicaid Other | Attending: Obstetrics and Gynecology | Admitting: Obstetrics and Gynecology

## 2019-12-05 ENCOUNTER — Encounter: Payer: Self-pay | Admitting: Obstetrics and Gynecology

## 2019-12-05 DIAGNOSIS — F4001 Agoraphobia with panic disorder: Secondary | ICD-10-CM | POA: Diagnosis present

## 2019-12-05 DIAGNOSIS — O99324 Drug use complicating childbirth: Secondary | ICD-10-CM | POA: Diagnosis present

## 2019-12-05 DIAGNOSIS — O99012 Anemia complicating pregnancy, second trimester: Secondary | ICD-10-CM

## 2019-12-05 DIAGNOSIS — Z20822 Contact with and (suspected) exposure to covid-19: Secondary | ICD-10-CM | POA: Diagnosis present

## 2019-12-05 DIAGNOSIS — O9852 Other viral diseases complicating childbirth: Secondary | ICD-10-CM | POA: Diagnosis present

## 2019-12-05 DIAGNOSIS — O26891 Other specified pregnancy related conditions, first trimester: Secondary | ICD-10-CM

## 2019-12-05 DIAGNOSIS — O4292 Full-term premature rupture of membranes, unspecified as to length of time between rupture and onset of labor: Secondary | ICD-10-CM | POA: Diagnosis present

## 2019-12-05 DIAGNOSIS — Z87891 Personal history of nicotine dependence: Secondary | ICD-10-CM

## 2019-12-05 DIAGNOSIS — Z3A38 38 weeks gestation of pregnancy: Secondary | ICD-10-CM | POA: Diagnosis not present

## 2019-12-05 DIAGNOSIS — F411 Generalized anxiety disorder: Secondary | ICD-10-CM | POA: Diagnosis present

## 2019-12-05 DIAGNOSIS — O99344 Other mental disorders complicating childbirth: Secondary | ICD-10-CM | POA: Diagnosis present

## 2019-12-05 DIAGNOSIS — O26893 Other specified pregnancy related conditions, third trimester: Secondary | ICD-10-CM | POA: Diagnosis present

## 2019-12-05 DIAGNOSIS — O4290 Premature rupture of membranes, unspecified as to length of time between rupture and onset of labor, unspecified weeks of gestation: Secondary | ICD-10-CM | POA: Diagnosis present

## 2019-12-05 DIAGNOSIS — B001 Herpesviral vesicular dermatitis: Secondary | ICD-10-CM | POA: Diagnosis present

## 2019-12-05 DIAGNOSIS — Z3400 Encounter for supervision of normal first pregnancy, unspecified trimester: Secondary | ICD-10-CM | POA: Diagnosis not present

## 2019-12-05 DIAGNOSIS — F329 Major depressive disorder, single episode, unspecified: Secondary | ICD-10-CM | POA: Diagnosis present

## 2019-12-05 DIAGNOSIS — Z6791 Unspecified blood type, Rh negative: Secondary | ICD-10-CM | POA: Diagnosis not present

## 2019-12-05 DIAGNOSIS — O99810 Abnormal glucose complicating pregnancy: Secondary | ICD-10-CM

## 2019-12-05 DIAGNOSIS — F1290 Cannabis use, unspecified, uncomplicated: Secondary | ICD-10-CM | POA: Diagnosis present

## 2019-12-05 LAB — TYPE AND SCREEN
ABO/RH(D): A NEG
Antibody Screen: POSITIVE

## 2019-12-05 LAB — CBC
HCT: 31.1 % — ABNORMAL LOW (ref 36.0–46.0)
Hemoglobin: 10.4 g/dL — ABNORMAL LOW (ref 12.0–15.0)
MCH: 29.1 pg (ref 26.0–34.0)
MCHC: 33.4 g/dL (ref 30.0–36.0)
MCV: 86.9 fL (ref 80.0–100.0)
Platelets: 210 10*3/uL (ref 150–400)
RBC: 3.58 MIL/uL — ABNORMAL LOW (ref 3.87–5.11)
RDW: 14.7 % (ref 11.5–15.5)
WBC: 14 10*3/uL — ABNORMAL HIGH (ref 4.0–10.5)
nRBC: 0 % (ref 0.0–0.2)

## 2019-12-05 LAB — COMPREHENSIVE METABOLIC PANEL
ALT: 13 U/L (ref 0–44)
AST: 20 U/L (ref 15–41)
Albumin: 2.9 g/dL — ABNORMAL LOW (ref 3.5–5.0)
Alkaline Phosphatase: 315 U/L — ABNORMAL HIGH (ref 38–126)
Anion gap: 8 (ref 5–15)
BUN: 15 mg/dL (ref 6–20)
CO2: 22 mmol/L (ref 22–32)
Calcium: 8.7 mg/dL — ABNORMAL LOW (ref 8.9–10.3)
Chloride: 104 mmol/L (ref 98–111)
Creatinine, Ser: 0.9 mg/dL (ref 0.44–1.00)
GFR calc Af Amer: >60 mL/min (ref >60)
GFR calc non Af Amer: >60 mL/min (ref >60)
Glucose, Bld: 95 mg/dL (ref 70–99)
Potassium: 3.8 mmol/L (ref 3.5–5.1)
Sodium: 134 mmol/L — ABNORMAL LOW (ref 135–145)
Total Bilirubin: 0.5 mg/dL (ref 0.3–1.2)
Total Protein: 6.8 g/dL (ref 6.5–8.1)

## 2019-12-05 LAB — RUPTURE OF MEMBRANE (ROM)PLUS: Rom Plus: POSITIVE

## 2019-12-05 LAB — RPR: RPR Ser Ql: NONREACTIVE

## 2019-12-05 LAB — PROTEIN / CREATININE RATIO, URINE
Creatinine, Urine: 321 mg/dL
Protein Creatinine Ratio: 0.26 mg/mg{Cre} — ABNORMAL HIGH (ref 0.00–0.15)
Total Protein, Urine: 85 mg/dL

## 2019-12-05 LAB — SARS CORONAVIRUS 2 BY RT PCR (HOSPITAL ORDER, PERFORMED IN ~~LOC~~ HOSPITAL LAB): SARS Coronavirus 2: NEGATIVE

## 2019-12-05 LAB — ABO/RH: ABO/RH(D): A NEG

## 2019-12-05 MED ORDER — MISOPROSTOL 200 MCG PO TABS
ORAL_TABLET | ORAL | Status: AC
  Filled 2019-12-05: qty 4

## 2019-12-05 MED ORDER — SOD CITRATE-CITRIC ACID 500-334 MG/5ML PO SOLN: 30.0000 mL | ORAL | Status: DC | PRN

## 2019-12-05 MED ORDER — LACTATED RINGERS IV SOLN
500.0000 mL | INTRAVENOUS | Status: DC | PRN
  Administered 2019-12-05: 500 mL via INTRAVENOUS

## 2019-12-05 MED ORDER — FENTANYL 2.5 MCG/ML W/ROPIVACAINE 0.15% IN NS 100 ML EPIDURAL (ARMC)
EPIDURAL | Status: DC | PRN
Start: 2019-12-05 — End: 2019-12-06
  Administered 2019-12-05: 250 ug via EPIDURAL
  Administered 2019-12-05: 10 mL/h via EPIDURAL

## 2019-12-05 MED ORDER — FENTANYL 2.5 MCG/ML W/ROPIVACAINE 0.15% IN NS 100 ML EPIDURAL (ARMC)
12.0000 mL/h | EPIDURAL | Status: DC
  Filled 2019-12-05: qty 100

## 2019-12-05 MED ORDER — PHENYLEPHRINE 40 MCG/ML (10ML) SYRINGE FOR IV PUSH (FOR BLOOD PRESSURE SUPPORT)
80.0000 ug | PREFILLED_SYRINGE | INTRAVENOUS | Status: DC | PRN
  Filled 2019-12-05: qty 10

## 2019-12-05 MED ORDER — ACETAMINOPHEN 325 MG PO TABS: 650.0000 mg | ORAL_TABLET | ORAL | Status: DC | PRN

## 2019-12-05 MED ORDER — EPHEDRINE 5 MG/ML INJ
10.0000 mg | INTRAVENOUS | Status: DC | PRN
  Filled 2019-12-05: qty 2

## 2019-12-05 MED ORDER — LIDOCAINE-EPINEPHRINE (PF) 1.5 %-1:200000 IJ SOLN
INTRAMUSCULAR | Status: DC | PRN
  Administered 2019-12-05: 3 mL via EPIDURAL

## 2019-12-05 MED ORDER — OXYCODONE-ACETAMINOPHEN 5-325 MG PO TABS: 2.0000 | ORAL_TABLET | ORAL | Status: DC | PRN

## 2019-12-05 MED ORDER — OXYTOCIN-SODIUM CHLORIDE 30-0.9 UT/500ML-% IV SOLN
2.5000 [IU]/h | INTRAVENOUS | Status: DC
  Filled 2019-12-05: qty 1000

## 2019-12-05 MED ORDER — OXYTOCIN 10 UNIT/ML IJ SOLN
INTRAMUSCULAR | Status: AC
  Filled 2019-12-05: qty 2

## 2019-12-05 MED ORDER — OXYTOCIN-SODIUM CHLORIDE 30-0.9 UT/500ML-% IV SOLN
1.0000 m[IU]/min | INTRAVENOUS | Status: DC
  Administered 2019-12-05: 2 m[IU]/min via INTRAVENOUS

## 2019-12-05 MED ORDER — LIDOCAINE HCL (PF) 1 % IJ SOLN
30.0000 mL | INTRAMUSCULAR | Status: DC | PRN
  Filled 2019-12-05: qty 30

## 2019-12-05 MED ORDER — AMMONIA AROMATIC IN INHA
RESPIRATORY_TRACT | Status: AC
  Filled 2019-12-05: qty 10

## 2019-12-05 MED ORDER — OXYCODONE-ACETAMINOPHEN 5-325 MG PO TABS: 1.0000 | ORAL_TABLET | ORAL | Status: DC | PRN

## 2019-12-05 MED ORDER — ONDANSETRON HCL 4 MG/2ML IJ SOLN
4.0000 mg | Freq: Four times a day (QID) | INTRAMUSCULAR | Status: DC | PRN
  Administered 2019-12-05: 4 mg via INTRAVENOUS
  Filled 2019-12-05: qty 2

## 2019-12-05 MED ORDER — OXYTOCIN BOLUS FROM INFUSION
333.0000 mL | Freq: Once | INTRAVENOUS | Status: AC
  Administered 2019-12-06: 333 mL via INTRAVENOUS

## 2019-12-05 MED ORDER — LACTATED RINGERS IV SOLN: INTRAVENOUS | Status: DC

## 2019-12-05 MED ORDER — FENTANYL 2.5 MCG/ML W/ROPIVACAINE 0.15% IN NS 100 ML EPIDURAL (ARMC)
EPIDURAL | Status: AC
  Filled 2019-12-05: qty 100

## 2019-12-05 MED ORDER — LACTATED RINGERS IV SOLN: 500.0000 mL | Freq: Once | INTRAVENOUS | Status: DC

## 2019-12-05 MED ORDER — TERBUTALINE SULFATE 1 MG/ML IJ SOLN: 0.2500 mg | Freq: Once | INTRAMUSCULAR | Status: DC | PRN

## 2019-12-05 MED ORDER — DIPHENHYDRAMINE HCL 50 MG/ML IJ SOLN: 12.5000 mg | INTRAMUSCULAR | Status: DC | PRN

## 2019-12-05 MED ORDER — BUPIVACAINE HCL (PF) 0.25 % IJ SOLN
INTRAMUSCULAR | Status: DC | PRN
  Administered 2019-12-05: 3 mL via EPIDURAL
  Administered 2019-12-05: 5 mL via EPIDURAL

## 2019-12-05 NOTE — Anesthesia Preprocedure Evaluation (Addendum)
Anesthesia Evaluation  Patient identified by MRN, date of birth, ID band Patient awake    Airway Mallampati: I  TM Distance: >3 FB     Dental no notable dental hx.    Pulmonary former smoker,    Pulmonary exam normal        Cardiovascular negative cardio ROS   Rhythm:Regular Rate:Normal     Neuro/Psych PSYCHIATRIC DISORDERS Anxiety Depression negative neurological ROS     GI/Hepatic negative GI ROS, Neg liver ROS,   Endo/Other  negative endocrine ROS  Renal/GU negative Renal ROS     Musculoskeletal   Abdominal   Peds negative pediatric ROS (+)  Hematology  (+) Blood dyscrasia, anemia ,   Anesthesia Other Findings   Reproductive/Obstetrics (+) Pregnancy                            Anesthesia Physical Anesthesia Plan  ASA: II  Anesthesia Plan: Epidural   Post-op Pain Management:    Induction:   PONV Risk Score and Plan:   Airway Management Planned:   Additional Equipment:   Intra-op Plan:   Post-operative Plan:   Informed Consent: I have reviewed the patients History and Physical, chart, labs and discussed the procedure including the risks, benefits and alternatives for the proposed anesthesia with the patient or authorized representative who has indicated his/her understanding and acceptance.       Plan Discussed with:   Anesthesia Plan Comments:         Anesthesia Quick Evaluation

## 2019-12-05 NOTE — Progress Notes (Signed)
Labor Progress Note  Jamie Dodson a 19 y.o.G1P0000 at [redacted]w[redacted]d by LMPadmitted for rupture of membranes  Subjective: Comfortable with epidural but reporting pressure  Objective: BP 125/64   Pulse 79   Temp 98.1 F (36.7 C) (Oral)   Resp 18   Ht 5\' 3"  (1.6 m)   Wt 75 kg   LMP 03/08/2019 (Exact Date)   SpO2 99%   BMI 29.30 kg/m   Fetal Assessment: FHT:  FHR: 130 bpm, variability: moderate,  accelerations:  Present,  decelerations:  Absent Category/reactivity:  Category I UC:   regular, every 1-3 minutes SVE:    Dilation: 9cm  Effacement: 100%  Station:  +1  Consistency: soft  Position:   Membrane status: SROM around 0234 AROM'd forebag at 1800 Amniotic color: clear   Assessment / Plan: PROM with no labor, Pitocin started at 0930  Labor: Pitocin at 65mU Preeclampsia:labs stable and125/64, will continue to monitor Fetal Wellbeing:Category I Pain Control:Epidural I/D:Afebrile, GBS neg, SROM x 20hrs Anticipated 4m  KVQ:QVZD, CNM 12/05/2019, 9:40 PM

## 2019-12-05 NOTE — OB Triage Note (Signed)
Pt G1P0 [redacted]w[redacted]d presents to birthplace w/ c/o LOF with blood tinge spotting, and mild contractions. Reports a large gush of fluid happening around 2:34AM. Reports positive fetal movement. Monitors applied and assessing. BP elevated, will continue to monitor, other VS WNL.

## 2019-12-05 NOTE — Progress Notes (Signed)
Labor Progress Note  Jamie Dodson is a 19 y.o. G1P0000 at [redacted]w[redacted]d by LMP admitted for rupture of membranes  Subjective: Pt is comfortable with epidural  Objective: BP 129/87 (BP Location: Right Arm)   Pulse 86   Temp 98.2 F (36.8 C) (Oral)   Resp 17   Ht 5\' 3"  (1.6 m)   Wt 75 kg   LMP 03/08/2019 (Exact Date)   SpO2 97%   BMI 29.30 kg/m    Fetal Assessment: FHT:  FHR: 140 bpm, variability: moderate,  accelerations:  Present,  decelerations:  Absent Category/reactivity:  Category I UC:    SVE:    Dilation: 4cm  Effacement: 90%  Station:  -2  Consistency: soft  Position: anterior  Membrane status:SROM at 0234 Amniotic color: Clear    Assessment / Plan: PROM with no labor, Pitocin started at 0930  Labor: Pitocin at 81mU Preeclampsia:  labs stable and 129/87, will continue to monitor Fetal Wellbeing:  Category I Pain Control:  Epidural I/D:  Afebrile, GBS neg, SROM x 13hrs Anticipated MOD:  NSVD  4m, CNM 12/05/2019, 3:34 PM

## 2019-12-05 NOTE — Anesthesia Procedure Notes (Signed)
Epidural Patient location during procedure: OB Start time: 12/05/2019 1:05 PM End time: 12/05/2019 1:40 PM  Staffing Resident/CRNA: Clovis Fredrickson, CRNA Performed: resident/CRNA   Preanesthetic Checklist Completed: patient identified, IV checked, site marked, risks and benefits discussed, surgical consent, monitors and equipment checked, pre-op evaluation and timeout performed  Epidural Patient position: sitting Prep: ChloraPrep Patient monitoring: heart rate, continuous pulse ox and blood pressure Approach: midline Location: L3-L4 Injection technique: LOR saline  Needle:  Needle type: Tuohy  Needle gauge: 18 G Needle length: 9 cm and 9 Needle insertion depth: 6.5 cm Catheter type: closed end flexible Catheter size: 20 Guage Catheter at skin depth: 10.5 cm Test dose: negative and 1.5% lidocaine with Epi 1:200 K  Assessment Sensory level: T10 Events: blood not aspirated, injection not painful, no injection resistance, no paresthesia and negative IV test  Additional Notes   Patient tolerated the insertion well without complications.Reason for block:procedure for pain

## 2019-12-05 NOTE — Progress Notes (Signed)
Labor Progress Note  Jamie Dodson is a 19 y.o. G1P0000 at [redacted]w[redacted]d by LMP admitted for rupture of membranes  Subjective: Pt is comfortable but reports cramping in her back  Objective: BP 133/90 (BP Location: Left Arm)   Pulse 80   Temp 97.7 F (36.5 C) (Oral)   Resp 16   Ht 5\' 3"  (1.6 m)   Wt 75 kg   LMP 03/08/2019 (Exact Date)   BMI 29.30 kg/m    Fetal Assessment: FHT:  FHR: 125 bpm, variability: moderate,  accelerations:  Present,  decelerations:  Absent Category/reactivity:  Category I UC:    SVE:    Dilation: 1cm  Effacement: 75%  Station:  -2  Consistency: soft  Position: anterior  Membrane status:SROM at 0234 Amniotic color: Clear  Labs: Lab Results  Component Value Date   WBC 14.0 (H) 12/05/2019   HGB 10.4 (L) 12/05/2019   HCT 31.1 (L) 12/05/2019   MCV 86.9 12/05/2019   PLT 210 12/05/2019    Assessment / Plan: PROM with no labor  Labor: No cervical change, starting pitocin Preeclampsia:  labs stable and 133/90, will continue to monitor Fetal Wellbeing:  Category I Pain Control:  Labor support without medications I/D:  Afebrile, GBS neg, SROM x 7hrs Anticipated MOD:  NSVD  12/07/2019, CNM 12/05/2019, 9:20 AM

## 2019-12-05 NOTE — Progress Notes (Signed)
Labor Progress Note  Jamie Dodson is a 19 y.o. G1P0000 at [redacted]w[redacted]d by LMP admitted for rupture of membranes  Subjective: Comfortable with epidural  Objective: BP 129/87 (BP Location: Right Arm)   Pulse 86   Temp 98.2 F (36.8 C) (Oral)   Resp 17   Ht 5\' 3"  (1.6 m)   Wt 75 kg   LMP 03/08/2019 (Exact Date)   SpO2 99%   BMI 29.30 kg/m   Fetal Assessment: FHT:  FHR: 120 bpm, variability: moderate,  accelerations:  Present,  decelerations:  Absent Category/reactivity:  Category I UC:   irregular, every 1.5-2 minutes SVE:    Dilation: 6cm  Effacement: 100%  Station:  -2  Consistency: soft  Position: anterior  Membrane status:SROM around 0234 AROM'd forebag at 1800 Amniotic color: bloody  Assessment / Plan:  PROM with no labor, Pitocin started at 0930  Labor: Pitocin at 70mU Preeclampsia:  labs stable and 129/87, will continue to monitor Fetal Wellbeing:  Category I Pain Control:  Epidural I/D:  Afebrile, GBS neg, SROM x 16hrs Anticipated MOD:  NSVD  4m, CNM 12/05/2019, 6:04 PM

## 2019-12-05 NOTE — H&P (Signed)
OB History & Physical   History of Present Illness:  Chief Complaint:   HPI:  Jamie Dodson is a 19 y.o. G1P0000 female at [redacted]w[redacted]d dated by LMP.  She presents to L&D for SROM and mild UCs  She reports:  -active fetal movement -LOF/SROM at 0234 -bloody show at 0234 -onset of mild contractions at 0234 currently every 1-5 minutes  Pregnancy Issues: 1. Panic disorders with agoraphobia - Zoloft 75mg  QD 2. Fever blisters 3. Benzodiazepine abuse - states last use 1 yr ago 4. Marijuana use - +UDS on 11/04/19 5. Rh neg 6. Generalized anxiety 7. Anemia 8. Depression 9. Equivocal 3 hr GTT (one result elevated - 1hr = 228)   Maternal Medical History:   Past Medical History:  Diagnosis Date  . ADHD (attention deficit hyperactivity disorder)    patient denies this dx.   . Anemia affecting pregnancy in second trimester 09/22/2019  . Anxiety    Hx anxiety/panic feelings-no med currently  . 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   ER note 03/08/2018  . 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  . Ureteral reflux     Past Surgical History:  Procedure Laterality Date  . kidney surgery    . SKIN GRAFT     Hand    No Known Allergies  Prior to Admission medications   Medication Sig Start Date End Date Taking? Authorizing Provider  ferrous sulfate 324 (65 Fe) MG TBEC Take 1 tablet (325 mg total) by mouth daily. 09/22/19  Yes 11/23/19, MD  Prenatal Vit-Fe Fumarate-FA (MULTIVITAMIN-PRENATAL) 27-0.8 MG TABS tablet Take 1 tablet by mouth daily at 12 noon.   Yes [provider]  sertraline (ZOLOFT) 25 MG tablet TAKE 3 TABLETS BY MOUTH DAILY Patient taking differently: 75 mg daily.  10/06/19 01/04/20 Yes Staples, 01/06/20, PA-C     Prenatal care site: [facility]   Social History: She  reports that she has quit smoking. She has never used smokeless tobacco. She reports previous alcohol use. She reports previous drug use. Drug: Marijuana.  Family History:  family history includes Anxiety disorder in her mother; Depression in her mother; Diabetes in her maternal grandmother; Hypertension in her maternal grandfather and maternal grandmother.   Review of Systems: A full review of systems was performed and negative except as noted in the HPI.    Physical Exam:  Vital Signs: BP 129/83   Pulse 78   Temp 98 F (36.7 C) (Oral)   Resp 16   Ht 5\' 3"  (1.6 m)   Wt 75 kg   LMP 03/08/2019 (Exact Date)   BMI 29.30 kg/m   General:   alert and cooperative  Skin:  normal  Neurologic:    Alert & oriented x 3  Lungs:   nl effort  Heart:   regular rate and rhythm  Abdomen:  soft, non-tender; bowel sounds normal; no masses,  no organomegaly  Extremities: : non-tender, symmetric     Results for orders placed or performed during the hospital encounter of 12/05/19 (from the past 24 hour(s))  ROM Plus ([facility] only)     Status: None   Collection Time: 12/05/19  3:51 AM  Result Value Ref Range   Rom Plus POSITIVE   Protein / creatinine ratio, urine     Status: Abnormal   Collection Time: 12/05/19  3:51 AM  Result Value Ref Range   Creatinine, Urine 321 mg/dL   Total Protein, Urine 85 mg/dL   Protein Creatinine Ratio 0.26 (H) 0.00 -  0.15 mg/mg[Cre]  CBC     Status: Abnormal   Collection Time: 12/05/19  4:14 AM  Result Value Ref Range   WBC 14.0 (H) 4.0 - 10.5 K/uL   RBC 3.58 (L) 3.87 - 5.11 MIL/uL   Hemoglobin 10.4 (L) 12.0 - 15.0 g/dL   HCT 70.0 (L) 36 - 46 %   MCV 86.9 80.0 - 100.0 fL   MCH 29.1 26.0 - 34.0 pg   MCHC 33.4 30.0 - 36.0 g/dL   RDW 17.4 94.4 - 96.7 %   Platelets 210 150 - 400 K/uL   nRBC 0.0 0.0 - 0.2 %  Comprehensive metabolic panel     Status: Abnormal   Collection Time: 12/05/19  4:14 AM  Result Value Ref Range   Sodium 134 (L) 135 - 145 mmol/L   Potassium 3.8 3.5 - 5.1 mmol/L   Chloride 104 98 - 111 mmol/L   CO2 22 22 - 32 mmol/L   Glucose, Bld 95 70 - 99 mg/dL   BUN 15 6 - 20 mg/dL   Creatinine, Ser 5.91 0.44 - 1.00 mg/dL    Calcium 8.7 (L) 8.9 - 10.3 mg/dL   Total Protein 6.8 6.5 - 8.1 g/dL   Albumin 2.9 (L) 3.5 - 5.0 g/dL   AST 20 15 - 41 U/L   ALT 13 0 - 44 U/L   Alkaline Phosphatase 315 (H) 38 - 126 U/L   Total Bilirubin 0.5 0.3 - 1.2 mg/dL   GFR calc non Af Amer >60 >60 mL/min   GFR calc Af Amer >60 >60 mL/min   Anion gap 8 5 - 15    Pertinent Results:  Prenatal Labs: Blood type/Rh A neg  Antibody screen neg  Rubella   Varicella   RPR NR  HBsAg Neg  HIV NR  GC neg  Chlamydia neg  Genetic screening MaterniT21 negative AFP negative  1 hour GTT 167  3 hour GTT 84, 228, 134, 96  GBS Negative  UDS 11/25/19 Negative  UDS 11/04/19 Pos for Cannabinoids   FHT: FHR: 125 bpm, variability: moderate,  accelerations:  Present,  decelerations:  Absent Category/reactivity:  Category I TOCO: regular, every 2-3 minutes SVE: Dilation: 1.5 / Effacement (%): 70 / Station: -3     Assessment:  [name] is a 19 y.o. G1P0000 female at [redacted]w[redacted]d with SROM and UCs.   Plan:  1. Admit to Labor & Delivery; consents reviewed and obtained  2. Fetal Well being  - Fetal Tracing: Cat I - GBS neg - Presentation: vtx confirmed by SVE   3. Routine OB: - Prenatal labs reviewed, as above - Rh neg - CBC & T&S on admit - Clear fluids, IVF  4. Monitoring of Labor -  Contractions: external toco in place -  Plan for continuous fetal monitoring  -  Maternal pain control as desired: IVPM, nitrous, regional anesthesia - Anticipate vaginal delivery  5. Post Partum Planning: - Infant feeding: Breast - Contraception: POPs - Tdap 09/22/19 - Flu declined on 05/26/19  Haroldine Laws, CNM 12/05/2019 5:07 AM

## 2019-12-06 ENCOUNTER — Encounter: Payer: Self-pay | Admitting: Obstetrics and Gynecology

## 2019-12-06 DIAGNOSIS — Z3400 Encounter for supervision of normal first pregnancy, unspecified trimester: Secondary | ICD-10-CM | POA: Diagnosis not present

## 2019-12-06 LAB — CBC
HCT: 30 % — ABNORMAL LOW (ref 36.0–46.0)
Hemoglobin: 10 g/dL — ABNORMAL LOW (ref 12.0–15.0)
MCH: 28.7 pg (ref 26.0–34.0)
MCHC: 33.3 g/dL (ref 30.0–36.0)
MCV: 86.2 fL (ref 80.0–100.0)
Platelets: 188 10*3/uL (ref 150–400)
RBC: 3.48 MIL/uL — ABNORMAL LOW (ref 3.87–5.11)
RDW: 14.8 % (ref 11.5–15.5)
WBC: 15.6 10*3/uL — ABNORMAL HIGH (ref 4.0–10.5)
nRBC: 0 % (ref 0.0–0.2)

## 2019-12-06 MED ORDER — SERTRALINE HCL 25 MG PO TABS
75.0000 mg | ORAL_TABLET | Freq: Every day | ORAL | Status: DC
  Administered 2019-12-06 – 2019-12-07 (×2): 75 mg via ORAL
  Filled 2019-12-06 (×2): qty 3

## 2019-12-06 MED ORDER — COCONUT OIL OIL
1.0000 "application " | TOPICAL_OIL | Status: DC | PRN
  Filled 2019-12-06 (×2): qty 120

## 2019-12-06 MED ORDER — SIMETHICONE 80 MG PO CHEW: 80.0000 mg | CHEWABLE_TABLET | ORAL | Status: DC | PRN

## 2019-12-06 MED ORDER — OXYCODONE HCL 5 MG PO TABS: 10.0000 mg | ORAL_TABLET | ORAL | Status: DC | PRN

## 2019-12-06 MED ORDER — PRENATAL MULTIVITAMIN CH
1.0000 | ORAL_TABLET | Freq: Every day | ORAL | Status: DC
  Administered 2019-12-06 – 2019-12-07 (×2): 1 via ORAL
  Filled 2019-12-06 (×2): qty 1

## 2019-12-06 MED ORDER — IBUPROFEN 600 MG PO TABS
600.0000 mg | ORAL_TABLET | Freq: Four times a day (QID) | ORAL | Status: DC
  Administered 2019-12-06 – 2019-12-07 (×4): 600 mg via ORAL
  Filled 2019-12-06 (×4): qty 1

## 2019-12-06 MED ORDER — WITCH HAZEL-GLYCERIN EX PADS: 1.0000 "application " | MEDICATED_PAD | CUTANEOUS | Status: DC | PRN

## 2019-12-06 MED ORDER — DIBUCAINE (PERIANAL) 1 % EX OINT: 1.0000 "application " | TOPICAL_OINTMENT | CUTANEOUS | Status: DC | PRN

## 2019-12-06 MED ORDER — ONDANSETRON HCL 4 MG PO TABS: 4.0000 mg | ORAL_TABLET | ORAL | Status: DC | PRN

## 2019-12-06 MED ORDER — OXYCODONE HCL 5 MG PO TABS: 5.0000 mg | ORAL_TABLET | ORAL | Status: DC | PRN

## 2019-12-06 MED ORDER — ACETAMINOPHEN 325 MG PO TABS
650.0000 mg | ORAL_TABLET | ORAL | Status: DC | PRN
  Administered 2019-12-06 – 2019-12-07 (×2): 650 mg via ORAL
  Filled 2019-12-06 (×2): qty 2

## 2019-12-06 MED ORDER — BENZOCAINE-MENTHOL 20-0.5 % EX AERO
1.0000 "application " | INHALATION_SPRAY | CUTANEOUS | Status: DC | PRN
  Filled 2019-12-06: qty 56

## 2019-12-06 MED ORDER — ONDANSETRON HCL 4 MG/2ML IJ SOLN: 4.0000 mg | INTRAMUSCULAR | Status: DC | PRN

## 2019-12-06 MED ORDER — TETANUS-DIPHTH-ACELL PERTUSSIS 5-2.5-18.5 LF-MCG/0.5 IM SUSP: 0.5000 mL | Freq: Once | INTRAMUSCULAR | Status: DC

## 2019-12-06 MED ORDER — FERROUS SULFATE 325 (65 FE) MG PO TABS
325.0000 mg | ORAL_TABLET | Freq: Two times a day (BID) | ORAL | Status: DC
  Administered 2019-12-06 – 2019-12-07 (×3): 325 mg via ORAL
  Filled 2019-12-06 (×3): qty 1

## 2019-12-06 MED ORDER — DIPHENHYDRAMINE HCL 25 MG PO CAPS: 25.0000 mg | ORAL_CAPSULE | Freq: Four times a day (QID) | ORAL | Status: DC | PRN

## 2019-12-06 MED ORDER — IBUPROFEN 600 MG PO TABS
ORAL_TABLET | ORAL | Status: AC
  Administered 2019-12-06: 600 mg via ORAL
  Filled 2019-12-06: qty 1

## 2019-12-06 MED ORDER — DOCUSATE SODIUM 100 MG PO CAPS
100.0000 mg | ORAL_CAPSULE | Freq: Two times a day (BID) | ORAL | Status: DC
  Administered 2019-12-07 (×2): 100 mg via ORAL
  Filled 2019-12-06 (×2): qty 1

## 2019-12-06 NOTE — Plan of Care (Signed)
Transferred to Room 345. Oriented to Room. Safety and Security, Fall Prevention and POC. Pt. V/O. Assessment and VS wnl.

## 2019-12-06 NOTE — Anesthesia Postprocedure Evaluation (Signed)
Anesthesia Post Note  Patient: Jamie Dodson  Procedure(s) Performed: AN AD HOC LABOR EPIDURAL  Patient location during evaluation: Mother Baby Anesthesia Type: Epidural Level of consciousness: awake and alert Pain management: pain level controlled Vital Signs Assessment: post-procedure vital signs reviewed and stable Respiratory status: spontaneous breathing, nonlabored ventilation and respiratory function stable Cardiovascular status: stable Postop Assessment: no headache, no backache, patient able to bend at knees and able to ambulate Anesthetic complications: no   No complications documented.   Last Vitals:  Vitals:   12/06/19 0553 12/06/19 0803  BP: 123/82 113/74  Pulse: 92 88  Resp: 18 20  Temp: 36.8 C 36.8 C  SpO2: 97% 97%    Last Pain:  Vitals:   12/06/19 0803  TempSrc: Oral  PainSc:                  Cleda Mccreedy Linnaea Ahn

## 2019-12-06 NOTE — Discharge Summary (Signed)
Obstetrical Discharge Summary  Patient Name: Jamie Dodson DOB: November 08, 2000 MRN: [id number]  Date of Admission: 12/05/2019 Date of Delivery: 12/06/19 Delivered by: Linda Hedges Date of Discharge: 12/07/2019  Primary OB:  ACHD  SWF:UXNATFT'D last menstrual period was 03/08/2019 (exact date). EDC Estimated Date of Delivery: 12/13/19 Gestational Age at Delivery: [redacted]w[redacted]d  Antepartum complications:  1. Panic disorders with agoraphobia - Zoloft 758mQD 2. Fever blisters 3. Benzodiazepine abuse - states last use 1 yr ago 4. Marijuana use - +UDS on 11/04/19 5. Rh neg 6. Generalized anxiety 7. Anemia 8. Depression 9. Equivocal 3 hr GTT (one result elevated - 1hr = 228) Admitting Diagnosis: PROM Secondary Diagnosis: Patient Active Problem List   Diagnosis Date Noted  . NSVD (normal spontaneous vaginal delivery) 12/06/2019  . Vaginal bleeding in pregnancy, third trimester 11/11/2019  . 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  . Abnormal glucose tolerance test (GTT) during pregnancy, antepartum 09/23/2019  . Anemia affecting pregnancy in second trimester 09/22/2019  . GAD (generalized anxiety disorder) 08/18/2019  . Rh negative status during pregnancy in first trimester 05/27/2019  . Supervision of normal first pregnancy, antepartum 05/26/2019  . Marijuana use; +UDS 05/26/19 MJ; +UDS 08/18/19 MJ; +UDS 10/07/19 MJ; +UDS MJ 10/21/19; +UDS MJ 11/04/19 05/26/2019  . Benzodiazepine abuse ([place]12/21/2019  . 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 Fever blister 06/20/2015    Augmentation: AROM and Pitocin Complications: MSAF, short cord, cord insertion to placenta was not central  Intrapartum complications/course:  Delivery Type: spontaneous vaginal delivery Anesthesia: epidural Placenta: spontaneous Laceration: 1st degree Episiotomy: none Newborn Data: Live born female  Birth Weight:  5lbs 14oz, 2670g APGAR: 8, 9  Newborn Delivery   Birth date/time: 12/06/2019 01:06:00 Delivery  type: Vaginal, Spontaneous      19yo G4P1021 at 39+4wks presenting with PROM, SROM with clear fluid.  She progressed to complete and pushed over an intact perineum and delivered the fetal head, followed promptly by the shoulders. She was in control the whole time, and the baby placed on the maternal abdomen. Delayed cord clamping and the FOB cut her cord, while she was skin to skin. The placenta delivered spontaneously and intact. Small 1st degree laceration was repaired. Mom and baby tolerated the procedure well.   Postpartum Procedures: none  Post partum course:  Patient had an uncomplicated postpartum course.  By time of discharge on PPD#1, her pain was controlled on oral pain medications; she had appropriate lochia and was ambulating, voiding without difficulty and tolerating regular diet.  She was deemed stable for discharge to home.    Discharge Physical Exam:  BP 137/90 (BP Location: Right Arm)   Pulse 81   Temp 98.2 F (36.8 C) (Oral)   Resp 18   Ht 5' 3"  (1.6 m)   Wt 75 kg   LMP 03/08/2019 (Exact Date)   SpO2 99%   Breastfeeding Unknown   BMI 29.30 kg/m   General: alert and no distress Pulm: normal respiratory effort Lochia: appropriate Abdomen: soft, NT Uterine Fundus: firm, below umbilicus Perineum: minimal edema, repair approximated  Extremities: No evidence of DVT seen on physical exam. No lower extremity edema. Edinburgh: Jamie Dodson Depression Scale Screening Tool 12/06/2019  I have been able to laugh and see the funny side of things. 0  I have looked forward with enjoyment to things. 0  I have blamed myself unnecessarily when things went wrong. 2  I have been anxious or worried for no good reason. 3  I have felt scared  or panicky for no good reason. 2  Things have been getting on top of me. 2  I have been so unhappy that I have had difficulty sleeping. 0  I have felt sad or miserable. 0  I have been so unhappy that I have been crying. 0  The thought  of harming myself has occurred to me. 0  Edinburgh Postnatal Depression Scale Total 9     Labs: CBC Latest Ref Rng & Units 12/06/2019 12/05/2019 09/22/2019  WBC 4.0 - 10.5 K/uL 15.6(H) 14.0(H) 13.9(H)  Hemoglobin 12.0 - 15.0 g/dL 10.0(L) 10.4(L) 10.2(L)  Hematocrit 36 - 46 % 30.0(L) 31.1(L) 30.6(L)  Platelets 150 - 400 K/uL 188 210 246   A NEG Hemoglobin  Date Value Ref Range Status  12/06/2019 10.0 (L) 12.0 - 15.0 g/dL Final  09/22/2019 10.2 (L) 11.1 - 15.9 g/dL Final   HCT  Date Value Ref Range Status  12/06/2019 30.0 (L) 36 - 46 % Final   Hematocrit  Date Value Ref Range Status  09/22/2019 30.6 (L) 34.0 - 46.6 % Final    Disposition: stable, discharge to home Baby Feeding: breastmilk Baby Disposition: home with mom  Contraception: POPs  Prenatal Labs:  Blood type/Rh A neg  Antibody screen neg  Rubella MMR x 2  Varicella Immune   RPR NR  HBsAg Neg  HIV NR  GC neg  Chlamydia neg  Genetic screening MaterniT21 negative AFP negative  1 hour GTT 167  3 hour GTT 84, 228, 134, 96  GBS Negative  UDS 11/25/19 Negative  UDS 11/04/19 Pos for Cannabinoids   Rh Immune globulin given: ordered Rubella vaccine given: n/a Varicella vaccine given: n/a Tdap vaccine given in AP or PP setting: 09/22/19 Flu vaccine given in AP or PP setting: 05/26/19  Plan: Jamie Dodson was discharged to home in good condition. Follow-up appointment with delivering provider in 6 weeks.  Discharge Instructions: Per After Visit Summary. Activity: Advance as tolerated. Pelvic rest for 6 weeks.   Diet: Regular Discharge Medications: Allergies as of 12/07/2019   No Known Allergies     Medication List    TAKE these medications   acetaminophen 325 MG tablet Commonly known as: Tylenol Take 2 tablets (650 mg total) by mouth every 4 (four) hours as needed (for pain scale < 4).   ferrous sulfate 324 (65 Fe) MG Tbec Take 1 tablet (325 mg total) by mouth daily.   ibuprofen 600 MG tablet Commonly  known as: ADVIL Take 1 tablet (600 mg total) by mouth every 6 (six) hours as needed for mild pain or moderate pain.   multivitamin-prenatal 27-0.8 MG Tabs tablet Take 1 tablet by mouth daily at 12 noon.   sertraline 50 MG tablet Commonly known as: Zoloft Take 1.5 tablets (75 mg total) by mouth daily. What changed: medication strength      Outpatient follow up:   Follow-up Information    Linda Hedges, CNM. Schedule an appointment as soon as possible for a visit in 6 week(s).   Specialty: Certified Nurse Midwife Contact information: [place] [address] [phone]               Signed:  Drinda Butts, [place] Certified Nurse Midwife [facility]

## 2019-12-06 NOTE — Lactation Note (Signed)
This note was copied from a baby's chart. Lactation Consultation Note  Patient Name: Jamie Dodson Today's Date: 12/06/2019 Reason for consult: Follow-up assessment;Mother's request;Primapara;Term;Infant < 6lbs;Other (Comment) (Breast feeding is improving)  At the beginning of the shift mom struggled to get the baby to open her mouth wide to get a deep latch.  She was having difficulty sustaining the latch with her slipping on and off the breast with deep dimpling and ending up with a shallow latch.  Demonstrated how to hand express copious amounts of colostrum which we could drip in her mouth.  By the end of the shift she was getting a deeper latch and maintaining the latch for longer intervals. Mom was latching her on her own.  She does have a tendency to fall asleep at the breast and then fuss when she was taken off the breast.  Demonstrated how to massage breast and gently stimulate her to keep her actively sucking.  As sucking became stronger, mom was getting a little tender. Demonstrated how to hand express colostrum after feeding to rub on nipples and let air dry to prevent bacteria, lubricate and for comfort.  Coconut oil given with instructions in use.  Hand out given on what to expect the first 4 days of life and reviewed normal newborn stomach size, feeding cues, supply and demand, adequate intake and out put, normal course of lactation and routine newborn feeding patterns.  As first time parents, they had lots of lactation questions which were addressed such as when and how to pump and introduce the bottle, how to know she is getting enough from the breast, how long to breast feed, etc.  Lactation [employer] given and reviewed.  Lactation name and number written on white board and encouraged to call with any questions, concerns or assistance.   Maternal Data Formula Feeding for Exclusion: No Has patient been taught Hand Expression?: Yes Does the patient have breastfeeding  experience prior to this delivery?: No (Gr1)  Feeding Feeding Type: Breast Fed  LATCH Score Latch: Repeated attempts needed to sustain latch, nipple held in mouth throughout feeding, stimulation needed to elicit sucking reflex.  Audible Swallowing: A few with stimulation  Type of Nipple: Everted at rest and after stimulation  Comfort (Breast/Nipple): Soft / non-tender  Hold (Positioning): No assistance needed to correctly position infant at breast.  LATCH Score: 8  Interventions Interventions: Breast feeding basics reviewed;Breast massage;Breast compression;Support pillows;Position options;Coconut oil  Lactation Tools Discussed/Used Tools: Coconut oil WIC Program: Yes   Consult Status Consult Status: Follow-up Follow-up type: Call as needed    Louis Meckel 12/06/2019, 8:50 PM

## 2019-12-06 NOTE — TOC Transition Note (Signed)
Transition of Care [facility]) - CM/SW Discharge Note   Patient Details  Name: Jamie Dodson MRN: [id number] Date of Birth: 12-11-2000  [facility] of [facility]) CM/SW Contact:  Marina Goodell Phone Number: [phone] 12/06/2019, 4:16 PM   Clinical Narrative:     CSw spoke with patient and baby's father.  The patient stated she has sufficient supports both from baby's father and extended family.  Patient stated she will stay at home with the baby, and baby's father works full-time.  Baby's father was holding baby and wanted to know if someone could tell him if he put the baby seat in properly.  CSW let family know if they had anymore questions to let me know.  Family verbalized understanding. TOC consult complete.   Final next level of care: Home/Self Care Barriers to Discharge: No Barriers Identified   Patient Goals and CMS Choice Patient states their goals for this hospitalization and ongoing recovery are:: To find a PCP for herself and pediatrician for baby.      Discharge Placement                       Discharge Plan and Services In-house Referral: Clinical Social Work                                   Social Determinants of Health (SDOH) Interventions     Readmission Risk Interventions No flowsheet data found.

## 2019-12-06 NOTE — Discharge Instructions (Signed)
Postpartum Care After Vaginal Delivery This sheet gives you information about how to care for yourself from the time you deliver your baby to up to 6-12 weeks after delivery (postpartum period). Your health care provider may also give you more specific instructions. If you have problems or questions, contact your health care provider. Follow these instructions at home: Vaginal bleeding  It is normal to have vaginal bleeding (lochia) after delivery. Wear a sanitary pad for vaginal bleeding and discharge. ? During the first week after delivery, the amount and appearance of lochia is often similar to a menstrual period. ? Over the next few weeks, it will gradually decrease to a dry, yellow-brown discharge. ? For most women, lochia stops completely by 4-6 weeks after delivery. Vaginal bleeding can vary from woman to woman.  Change your sanitary pads frequently. Watch for any changes in your flow, such as: ? A sudden increase in volume. ? A change in color. ? Large blood clots.  If you pass a blood clot from your vagina, save it and call your health care provider to discuss. Do not flush blood clots down the toilet before talking with your health care provider.  Do not use tampons or douches until your health care provider says this is safe.  If you are not breastfeeding, your period should return 6-8 weeks after delivery. If you are feeding your child breast milk only (exclusive breastfeeding), your period may not return until you stop breastfeeding. Perineal care  Keep the area between the vagina and the anus (perineum) clean and dry as told by your health care provider. Use medicated pads and pain-relieving sprays and creams as directed.  If you had a cut in the perineum (episiotomy) or a tear in the vagina, check the area for signs of infection until you are healed. Check for: ? More redness, swelling, or pain. ? Fluid or blood coming from the cut or tear. ? Warmth. ? Pus or a bad  smell.  You may be given a squirt bottle to use instead of wiping to clean the perineum area after you go to the bathroom. As you start healing, you may use the squirt bottle before wiping yourself. Make sure to wipe gently.  To relieve pain caused by an episiotomy, a tear in the vagina, or swollen veins in the anus (hemorrhoids), try taking a warm sitz bath 2-3 times a day. A sitz bath is a warm water bath that is taken while you are sitting down. The water should only come up to your hips and should cover your buttocks. Breast care  Within the first few days after delivery, your breasts may feel heavy, full, and uncomfortable (breast engorgement). Milk may also leak from your breasts. Your health care provider can suggest ways to help relieve the discomfort. Breast engorgement should go away within a few days.  If you are breastfeeding: ? Wear a bra that supports your breasts and fits you well. ? Keep your nipples clean and dry. Apply creams and ointments as told by your health care provider. ? You may need to use breast pads to absorb milk that leaks from your breasts. ? You may have uterine contractions every time you breastfeed for up to several weeks after delivery. Uterine contractions help your uterus return to its normal size. ? If you have any problems with breastfeeding, work with your health care provider or lactation consultant.  If you are not breastfeeding: ? Avoid touching your breasts a lot. Doing this can make   your breasts produce more milk. ? Wear a good-fitting bra and use cold packs to help with swelling. ? Do not squeeze out (express) milk. This causes you to make more milk. Intimacy and sexuality  Ask your health care provider when you can engage in sexual activity. This may depend on: ? Your risk of infection. ? How fast you are healing. ? Your comfort and desire to engage in sexual activity.  You are able to get pregnant after delivery, even if you have not had  your period. If desired, talk with your health care provider about methods of birth control (contraception). Medicines  Take over-the-counter and prescription medicines only as told by your health care provider.  If you were prescribed an antibiotic medicine, take it as told by your health care provider. Do not stop taking the antibiotic even if you start to feel better. Activity  Gradually return to your normal activities as told by your health care provider. Ask your health care provider what activities are safe for you.  Rest as much as possible. Try to rest or take a nap while your baby is sleeping. Eating and drinking   Drink enough fluid to keep your urine pale yellow.  Eat high-fiber foods every day. These may help prevent or relieve constipation. High-fiber foods include: ? Whole grain cereals and breads. ? Brown rice. ? Beans. ? Fresh fruits and vegetables.  Do not try to lose weight quickly by cutting back on calories.  Take your prenatal vitamins until your postpartum checkup or until your health care provider tells you it is okay to stop. Lifestyle  Do not use any products that contain nicotine or tobacco, such as cigarettes and e-cigarettes. If you need help quitting, ask your health care provider.  Do not drink alcohol, especially if you are breastfeeding. General instructions  Keep all follow-up visits for you and your baby as told by your health care provider. Most women visit their health care provider for a postpartum checkup within the first 3-6 weeks after delivery. Contact a health care provider if:  You feel unable to cope with the changes that your child brings to your life, and these feelings do not go away.  You feel unusually sad or worried.  Your breasts become red, painful, or hard.  You have a fever.  You have trouble holding urine or keeping urine from leaking.  You have little or no interest in activities you used to enjoy.  You have not  breastfed at all and you have not had a menstrual period for 12 weeks after delivery.  You have stopped breastfeeding and you have not had a menstrual period for 12 weeks after you stopped breastfeeding.  You have questions about caring for yourself or your baby.  You pass a blood clot from your vagina. Get help right away if:  You have chest pain.  You have difficulty breathing.  You have sudden, severe leg pain.  You have severe pain or cramping in your lower abdomen.  You bleed from your vagina so much that you fill more than one sanitary pad in one hour. Bleeding should not be heavier than your heaviest period.  You develop a severe headache.  You faint.  You have blurred vision or spots in your vision.  You have bad-smelling vaginal discharge.  You have thoughts about hurting yourself or your baby. If you ever feel like you may hurt yourself or others, or have thoughts about taking your own life, get help  right away. You can go to the nearest emergency department or call:  Your local emergency services (911 in the [place]  A suicide crisis helpline, such as the [place] at 773-567-8467. This is open 24 hours a day. Summary  The period of time right after you deliver your newborn up to 6-12 weeks after delivery is called the postpartum period.  Gradually return to your normal activities as told by your health care provider.  Keep all follow-up visits for you and your baby as told by your health care provider. This information is not intended to replace advice given to you by your health care provider. Make sure you discuss any questions you have with your health care provider. Document Revised: 03/08/2017 Document Reviewed: 12/17/2016 Elsevier Patient Education  2020 [employer].   Postpartum Baby Blues The postpartum period begins right after the birth of a baby. During this time, there is often a lot of joy and excitement. It is also  a time of many changes in the life of the parents. No matter how many times a mother gives birth, each child brings new challenges to the family, including different ways of relating to one another. It is common to have feelings of excitement along with confusing changes in moods, emotions, and thoughts. You may feel happy one minute and sad or stressed the next. These feelings of sadness usually happen in the period right after you have your baby, and they go away within a week or two. This is called the "baby blues." What are the causes? There is no known cause of baby blues. It is likely caused by a combination of factors. However, changes in hormone levels after childbirth are believed to trigger some of the symptoms. Other factors that can play a role in these mood changes include:  Lack of sleep.  Stressful life events, such as poverty, caring for a loved one, or death of a loved one.  Genetics. What are the signs or symptoms? Symptoms of this condition include:  Brief changes in mood, such as going from extreme happiness to sadness.  Decreased concentration.  Difficulty sleeping.  Crying spells and tearfulness.  Loss of appetite.  Irritability.  Anxiety. If the symptoms of baby blues last for more than 2 weeks or become more severe, you may have postpartum depression. How is this diagnosed? This condition is diagnosed based on an evaluation of your symptoms. There are no medical or lab tests that lead to a diagnosis, but there are various questionnaires that a health care provider may use to identify women with the baby blues or postpartum depression. How is this treated? Treatment is not needed for this condition. The baby blues usually go away on their own in 1-2 weeks. Social support is often all that is needed. You will be encouraged to get adequate sleep and rest. Follow these instructions at home: Lifestyle      Get as much rest as you can. Take a nap when the baby  sleeps.  Exercise regularly as told by your health care provider. Some women find yoga and walking to be helpful.  Eat a balanced and nourishing diet. This includes plenty of fruits and vegetables, whole grains, and lean proteins.  Do little things that you enjoy. Have a cup of tea, take a bubble bath, read your favorite magazine, or listen to your favorite music.  Avoid alcohol.  Ask for help with household chores, cooking, grocery shopping, or running errands. Do not try  to do everything yourself. Consider hiring a postpartum doula to help. This is a professional who specializes in providing support to new mothers. °· Try not to make any major life changes during pregnancy or right after giving birth. This can add stress. °General instructions °· Talk to people close to you about how you are feeling. Get support from your partner, family members, friends, or other new moms. You may want to join a support group. °· Find ways to cope with stress. This may include: °? Writing your thoughts and feelings in a journal. °? Spending time outside. °? Spending time with people who make you laugh. °· Try to stay positive in how you think. Think about the things you are grateful for. °· Take over-the-counter and prescription medicines only as told by your health care provider. °· Let your health care provider know if you have any concerns. °· Keep all postpartum visits as told by your health care provider. This is important. °Contact a health care provider if: °· Your baby blues do not go away after 2 weeks. °Get help right away if: °· You have thoughts of taking your own life (suicidal thoughts). °· You think you may harm the baby or other people. °· You see or hear things that are not there (hallucinations). °Summary °· After giving birth, you may feel happy one minute and sad or stressed the next. Feelings of sadness that happen right after the baby is born and go away after a week or two are called the "baby  blues." °· You can manage the baby blues by getting enough rest, eating a healthy diet, exercising, spending time with supportive people, and finding ways to cope with stress. °· If feelings of sadness and stress last longer than 2 weeks or get in the way of caring for your baby, talk to your health care provider. This may mean you have postpartum depression. °This information is not intended to replace advice given to you by your health care provider. Make sure you discuss any questions you have with your health care provider. °Document Revised: 06/27/2018 Document Reviewed: 05/01/2016 °Elsevier Patient Education © 2020 [employer] ° ° °Breastfeeding ° °Choosing to breastfeed is one of the best decisions you can make for yourself and your baby. A change in hormones during pregnancy causes your breasts to make breast milk in your milk-producing glands. Hormones prevent breast milk from being released before your baby is born. They also prompt milk flow after birth. Once breastfeeding has begun, thoughts of your baby, as well as his or her sucking or crying, can stimulate the release of milk from your milk-producing glands. °Benefits of breastfeeding °Research shows that breastfeeding offers many health benefits for infants and mothers. It also offers a cost-free and convenient way to feed your baby. °For your baby °· Your first milk (colostrum) helps your baby's digestive system to function better. °· Special cells in your milk (antibodies) help your baby to fight off infections. °· Breastfed babies are less likely to develop asthma, allergies, obesity, or type 2 diabetes. They are also at lower risk for sudden infant death syndrome (SIDS). °· Nutrients in breast milk are better able to meet your baby’s needs compared to infant formula. °· Breast milk improves your baby's brain development. °For you °· Breastfeeding helps to create a very special bond between you and your baby. °· Breastfeeding is convenient.  Breast milk costs nothing and is always available at the correct temperature. °· Breastfeeding helps to burn   calories. It helps you to lose the weight that you gained during pregnancy. °· Breastfeeding makes your uterus return faster to its size before pregnancy. It also slows bleeding (lochia) after you give birth. °· Breastfeeding helps to lower your risk of developing type 2 diabetes, osteoporosis, rheumatoid arthritis, cardiovascular disease, and breast, ovarian, uterine, and endometrial cancer later in life. °Breastfeeding basics °Starting breastfeeding °· Find a comfortable place to sit or lie down, with your neck and back well-supported. °· Place a pillow or a rolled-up blanket under your baby to bring him or her to the level of your breast (if you are seated). Nursing pillows are specially designed to help support your arms and your baby while you breastfeed. °· Make sure that your baby's tummy (abdomen) is facing your abdomen. °· Gently massage your breast. With your fingertips, massage from the outer edges of your breast inward toward the nipple. This encourages milk flow. If your milk flows slowly, you may need to continue this action during the feeding. °· Support your breast with 4 fingers underneath and your thumb above your nipple (make the letter "C" with your hand). Make sure your fingers are well away from your nipple and your baby’s mouth. °· Stroke your baby's lips gently with your finger or nipple. °· When your baby's mouth is open wide enough, quickly bring your baby to your breast, placing your entire nipple and as much of the areola as possible into your baby's mouth. The areola is the colored area around your nipple. °? More areola should be visible above your baby's upper lip than below the lower lip. °? Your baby's lips should be opened and extended outward (flanged) to ensure an adequate, comfortable latch. °? Your baby's tongue should be between his or her lower gum and your  breast. °· Make sure that your baby's mouth is correctly positioned around your nipple (latched). Your baby's lips should create a seal on your breast and be turned out (everted). °· It is common for your baby to suck about 2-3 minutes in order to start the flow of breast milk. °Latching °Teaching your baby how to latch onto your breast properly is very important. An improper latch can cause nipple pain, decreased milk supply, and poor weight gain in your baby. Also, if your baby is not latched onto your nipple properly, he or she may swallow some air during feeding. This can make your baby fussy. Burping your baby when you switch breasts during the feeding can help to get rid of the air. However, teaching your baby to latch on properly is still the best way to prevent fussiness from swallowing air while breastfeeding. °Signs that your baby has successfully latched onto your nipple °· Silent tugging or silent sucking, without causing you pain. Infant's lips should be extended outward (flanged). °· Swallowing heard between every 3-4 sucks once your milk has started to flow (after your let-down milk reflex occurs). °· Muscle movement above and in front of his or her ears while sucking. °Signs that your baby has not successfully latched onto your nipple °· Sucking sounds or smacking sounds from your baby while breastfeeding. °· Nipple pain. °If you think your baby has not latched on correctly, slip your finger into the corner of your baby’s mouth to break the suction and place it between your baby's gums. Attempt to start breastfeeding again. °Signs of successful breastfeeding °Signs from your baby °· Your baby will gradually decrease the number of sucks or will completely stop   sucking. °· Your baby will fall asleep. °· Your baby's body will relax. °· Your baby will retain a small amount of milk in his or her mouth. °· Your baby will let go of your breast by himself or herself. °Signs from you °· Breasts that have  increased in firmness, weight, and size 1-3 hours after feeding. °· Breasts that are softer immediately after breastfeeding. °· Increased milk volume, as well as a change in milk consistency and color by the fifth day of breastfeeding. °· Nipples that are not sore, cracked, or bleeding. °Signs that your baby is getting enough milk °· Wetting at least 1-2 diapers during the first 24 hours after birth. °· Wetting at least 5-6 diapers every 24 hours for the first week after birth. The urine should be clear or pale yellow by the age of 5 days. °· Wetting 6-8 diapers every 24 hours as your baby continues to grow and develop. °· At least 3 stools in a 24-hour period by the age of 5 days. The stool should be soft and yellow. °· At least 3 stools in a 24-hour period by the age of 7 days. The stool should be seedy and yellow. °· No loss of weight greater than 10% of birth weight during the first 3 days of life. °· Average weight gain of 4-7 oz (113-198 g) per week after the age of 4 days. °· Consistent daily weight gain by the age of 5 days, without weight loss after the age of 2 weeks. °After a feeding, your baby may spit up a small amount of milk. This is normal. °Breastfeeding frequency and duration °Frequent feeding will help you make more milk and can prevent sore nipples and extremely full breasts (breast engorgement). Breastfeed when you feel the need to reduce the fullness of your breasts or when your baby shows signs of hunger. This is called "breastfeeding on demand." Signs that your baby is hungry include: °· Increased alertness, activity, or restlessness. °· Movement of the head from side to side. °· Opening of the mouth when the corner of the mouth or cheek is stroked (rooting). °· Increased sucking sounds, smacking lips, cooing, sighing, or squeaking. °· Hand-to-mouth movements and sucking on fingers or hands. °· Fussing or crying. °Avoid introducing a pacifier to your baby in the first 4-6 weeks after your  baby is born. After this time, you may choose to use a pacifier. Research has shown that pacifier use during the first year of a baby's life decreases the risk of sudden infant death syndrome (SIDS). °Allow your baby to feed on each breast as long as he or she wants. When your baby unlatches or falls asleep while feeding from the first breast, offer the second breast. Because newborns are often sleepy in the first few weeks of life, you may need to awaken your baby to get him or her to feed. °Breastfeeding times will vary from baby to baby. However, the following rules can serve as a guide to help you make sure that your baby is properly fed: °· Newborns (babies 4 weeks of age or younger) may breastfeed every 1-3 hours. °· Newborns should not go without breastfeeding for longer than 3 hours during the day or 5 hours during the night. °· You should breastfeed your baby a minimum of 8 times in a 24-hour period. °Breast milk pumping ° °  ° °Pumping and storing breast milk allows you to make sure that your baby is exclusively fed your breast milk,   even at times when you are unable to breastfeed. This is especially important if you go back to work while you are still breastfeeding, or if you are not able to be present during feedings. Your lactation consultant can help you find a method of pumping that works best for you and give you guidelines about how long it is safe to store breast milk. °Caring for your breasts while you breastfeed °Nipples can become dry, cracked, and sore while breastfeeding. The following recommendations can help keep your breasts moisturized and healthy: °· Avoid using soap on your nipples. °· Wear a supportive bra designed especially for nursing. Avoid wearing underwire-style bras or extremely tight bras (sports bras). °· Air-dry your nipples for 3-4 minutes after each feeding. °· Use only cotton bra pads to absorb leaked breast milk. Leaking of breast milk between feedings is normal. °· Use  lanolin on your nipples after breastfeeding. Lanolin helps to maintain your skin's normal moisture barrier. Pure lanolin is not harmful (not toxic) to your baby. You may also hand express a few drops of breast milk and gently massage that milk into your nipples and allow the milk to air-dry. °In the first few weeks after giving birth, some women experience breast engorgement. Engorgement can make your breasts feel heavy, warm, and tender to the touch. Engorgement peaks within 3-5 days after you give birth. The following recommendations can help to ease engorgement: °· Completely empty your breasts while breastfeeding or pumping. You may want to start by applying warm, moist heat (in the shower or with warm, water-soaked hand towels) just before feeding or pumping. This increases circulation and helps the milk flow. If your baby does not completely empty your breasts while breastfeeding, pump any extra milk after he or she is finished. °· Apply ice packs to your breasts immediately after breastfeeding or pumping, unless this is too uncomfortable for you. To do this: °? Put ice in a plastic bag. °? Place a towel between your skin and the bag. °? Leave the ice on for 20 minutes, 2-3 times a day. °· Make sure that your baby is latched on and positioned properly while breastfeeding. °If engorgement persists after 48 hours of following these recommendations, contact your health care provider or a lactation consultant. °Overall health care recommendations while breastfeeding °· Eat 3 healthy meals and 3 snacks every day. Well-nourished mothers who are breastfeeding need an additional 450-500 calories a day. You can meet this requirement by increasing the amount of a balanced diet that you eat. °· Drink enough water to keep your urine pale yellow or clear. °· Rest often, relax, and continue to take your prenatal vitamins to prevent fatigue, stress, and low vitamin and mineral levels in your body (nutrient  deficiencies). °· Do not use any products that contain nicotine or tobacco, such as cigarettes and e-cigarettes. Your baby may be harmed by chemicals from cigarettes that pass into breast milk and exposure to secondhand smoke. If you need help quitting, ask your health care provider. °· Avoid alcohol. °· Do not use illegal drugs or marijuana. °· Talk with your health care provider before taking any medicines. These include over-the-counter and prescription medicines as well as vitamins and herbal supplements. Some medicines that may be harmful to your baby can pass through breast milk. °· It is possible to become pregnant while breastfeeding. If birth control is desired, ask your health care provider about options that will be safe while breastfeeding your baby. °Where to find more information: °  [place] [employer]: www.llli.org °Contact a health care provider if: °· You feel like you want to stop breastfeeding or have become frustrated with breastfeeding. °· Your nipples are cracked or bleeding. °· Your breasts are red, tender, or warm. °· You have: °? Painful breasts or nipples. °? A swollen area on either breast. °? A fever or chills. °? Nausea or vomiting. °? Drainage other than breast milk from your nipples. °· Your breasts do not become full before feedings by the fifth day after you give birth. °· You feel sad and depressed. °· Your baby is: °? Too sleepy to eat well. °? Having trouble sleeping. °? More than 1 week old and wetting fewer than 6 diapers in a 24-hour period. °? Not gaining weight by 5 days of age. °· Your baby has fewer than 3 stools in a 24-hour period. °· Your baby's skin or the white parts of his or her eyes become yellow. °Get help right away if: °· Your baby is overly tired (lethargic) and does not want to wake up and feed. °· Your baby develops an unexplained fever. °Summary °· Breastfeeding offers many health benefits for infant and mothers. °· Try to breastfeed your infant when  he or she shows early signs of hunger. °· Gently tickle or stroke your baby's lips with your finger or nipple to allow the baby to open his or her mouth. Bring the baby to your breast. Make sure that much of the areola is in your baby's mouth. Offer one side and burp the baby before you offer the other side. °· Talk with your health care provider or lactation consultant if you have questions or you face problems as you breastfeed. °This information is not intended to replace advice given to you by your health care provider. Make sure you discuss any questions you have with your health care provider. °Document Revised: 05/30/2017 Document Reviewed: 04/06/2016 °Elsevier Patient Education © 2020 Elsevier Inc. ° °

## 2019-12-07 ENCOUNTER — Ambulatory Visit: Payer: Medicaid Other | Admitting: Licensed Clinical Social Worker

## 2019-12-07 ENCOUNTER — Telehealth: Payer: Self-pay | Admitting: Licensed Clinical Social Worker

## 2019-12-07 LAB — FETAL SCREEN: Fetal Screen: NEGATIVE

## 2019-12-07 MED ORDER — ACETAMINOPHEN 325 MG PO TABS: 650.0000 mg | ORAL_TABLET | ORAL | Status: DC | PRN

## 2019-12-07 MED ORDER — IBUPROFEN 600 MG PO TABS: 600.0000 mg | ORAL_TABLET | Freq: Four times a day (QID) | ORAL | 0 refills | Status: DC | PRN

## 2019-12-07 MED ORDER — SERTRALINE HCL 50 MG PO TABS: 75.0000 mg | ORAL_TABLET | Freq: Every day | ORAL | 11 refills | Status: DC

## 2019-12-07 MED ORDER — RHO D IMMUNE GLOBULIN 1500 UNIT/2ML IJ SOSY
300.0000 ug | PREFILLED_SYRINGE | Freq: Once | INTRAMUSCULAR | Status: AC
  Administered 2019-12-07: 300 ug via INTRAVENOUS
  Filled 2019-12-07: qty 2

## 2019-12-07 NOTE — Progress Notes (Signed)
Discharge instructions, prescriptions, education, and appointments given and explained. Pt verbalized understanding with no further questions. Pt breastfeeding and will call when finished to be wheeled to personal vehicle.

## 2019-12-07 NOTE — Progress Notes (Signed)
Pt wheeled to personal vehicle with infant and all belongings via staff.

## 2019-12-07 NOTE — Telephone Encounter (Signed)
Patient did not show for Zoom appt. LCSW called and left vm for pt. After the call was made LCSW became aware that patient delivered on 12/06/19.

## 2019-12-07 NOTE — Lactation Note (Signed)
This note was copied from a baby's chart. Lactation Consultation Note  Patient Name: Jamie Dodson Today's Date: 12/07/2019 Reason for consult: Follow-up assessment;Mother's request;Difficult latch;Primapara;Term;Infant < 6lbs;Other (Comment) (Still sliding on & off breast - Nipple shield introduced )  Mom reports struggling through the night to keep her latched.  Observed mom trying to keep her latched, but she was on and off the breast sucking on her tongue.  Chelsea,  RN caring for baby, had suggested nipple shield which was introduced.  Once #20 nipple shield was placed, we hand expressed some colostrum into the nipple shield.  She was able to keep her tongue down better and sustain the latch longer.  Mom gave return demonstration with placement of the nipple shield on the second breast.  The cut out for the nose had to be readjusted, but otherwise mom was doing well with placement and latching Adalena to the nipple shield.  Mom could still feel a good tug at the breast and there was some colostrum in the nipple shield when came off the breast. Mom was more tender than she had been the night before.  No trauma to the nipples was noted.  Comfort gels given with instructions in alternating use with the coconut oil.  Mom has a DEBP at home if pumping is needed.  Lactation name and number has been written on white board and encouraged to call with any questions, concerns or assistance.     Maternal Data Formula Feeding for Exclusion: No Has patient been taught Hand Expression?: Yes Does the patient have breastfeeding experience prior to this delivery?: No (Gr1)  Feeding Feeding Type: Breast Fed  LATCH Score Latch: Repeated attempts needed to sustain latch, nipple held in mouth throughout feeding, stimulation needed to elicit sucking reflex.  Audible Swallowing: A few with stimulation  Type of Nipple: Everted at rest and after stimulation  Comfort (Breast/Nipple): Filling, red/small blisters  or bruises, mild/mod discomfort  Hold (Positioning): Assistance needed to correctly position infant at breast and maintain latch. (Assistance needed b/c started nipple shield)  LATCH Score: 6  Interventions Interventions: Breast feeding basics reviewed;Assisted with latch;Breast massage;Hand express;Breast compression;Adjust position;Support pillows;Position options;Coconut oil  Lactation Tools Discussed/Used Tools: Coconut oil;Nipple Dorris Carnes (given with Delmer Islam Lactation) Nipple shield size: 20 WIC Program: Yes   Consult Status Consult Status: Follow-up Follow-up type: Call as needed    Louis Meckel 12/07/2019, 10:33 AM

## 2019-12-08 LAB — RHOGAM INJECTION: Unit division: 0

## 2019-12-09 ENCOUNTER — Ambulatory Visit: Payer: Medicaid Other

## 2019-12-09 LAB — SURGICAL PATHOLOGY

## 2021-03-08 LAB — OB RESULTS CONSOLE GC/CHLAMYDIA: Chlamydia: NEGATIVE

## 2021-03-08 LAB — HM PAP SMEAR: HM Pap smear: NEGATIVE

## 2021-03-20 IMAGING — US US MFM OB DETAIL+14 WK
1 series · 12 of 28 positions shown · non-contrast
Comparison: none

PATIENT INFO:

PERFORMED BY:
                   Sonographer
SERVICE(S) PROVIDED:
 ----------------------------------------------------------------------
INDICATIONS:
  18 weeks gestation of pregnancy
FETAL EVALUATION:
 Num Of Fetuses:         1
 Fetal Heart Rate(bpm):  147
 Presentation:           Cephalic
 Placenta:               Anterior, No previa
 P. Cord Insertion:      Normal
BIOMETRY:
 BPD:      43.8  mm     G. Age:  19w 2d         78  %    CI:        73.52   %    70 - 86
                                                         FL/HC:      16.6   %    16.1 -
 HC:      162.3  mm     G. Age:  19w 0d         64  %
                                                         FL/BPD:     61.4   %
 FL:       26.9  mm     G. Age:  18w 1d         31  %
 HUM:      25.8  mm     G. Age:  18w 0d         40  %
OB HISTORY:
 Gravidity:    1
GESTATIONAL AGE:
 LMP:           18w 4d        Date:  03/08/19                 EDD:   12/13/19
 U/S Today:     18w 6d                                        EDD:   12/11/19
 Best:          18w 4d     Det. By:  LMP  (03/08/19)          EDD:   12/13/19
ANATOMY:
 Cavum:                 Suboptimal             Aortic Arch:            Normal appearance
 Ventricles:            Normal appearance      Ductal Arch:            Normal appearance
 Choroid Plexus:        Within Normal Limits   Diaphragm:              Within Normal Limits
 Cerebellum:            Within Normal Limits   Stomach:                Seen
 Posterior Fossa:       Within Normal Limits   Abdomen:                Within Normal
                                                                       Limits
 Nuchal Fold:           4.6 mm                 Abdominal Wall:         Normal appearance
 Face:                  Orbits visualized      Cord Vessels:           3 vessels
 Lips:                  Normal appearance      Kidneys:                Normal appearance
 Thoracic:              Within Normal Limits   Bladder:                Seen
 Heart:                 4-Chamber view         Spine:                  Cervical spine not
                        appears normal                                 well seen
 RVOT:                  Normal appearance      Upper Extremities:      Visualized
 LVOT:                  Normal appearance      Lower Extremities:      Visualized
CERVIX UTERUS ADNEXA:
 Cervix
 Length:           3.88  cm.

[Series 1: us mfm ob detail+14 wk · 0.23mm/px · 12 of 90 slices shown]
[im 4/90]
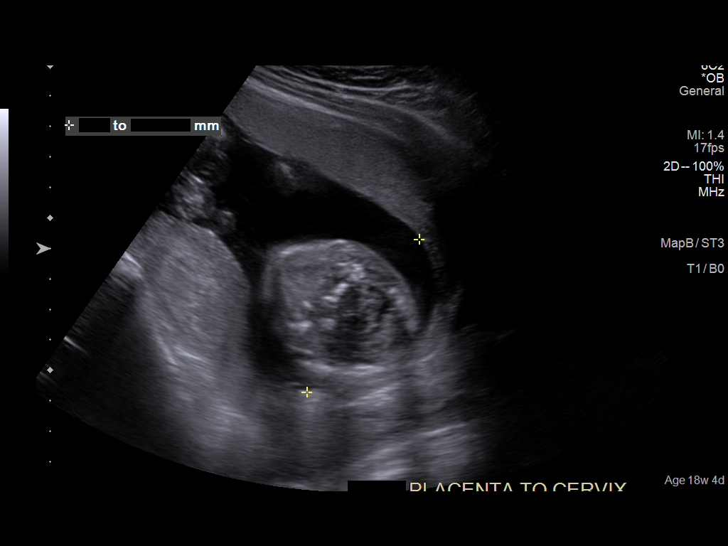
[im 10/90]
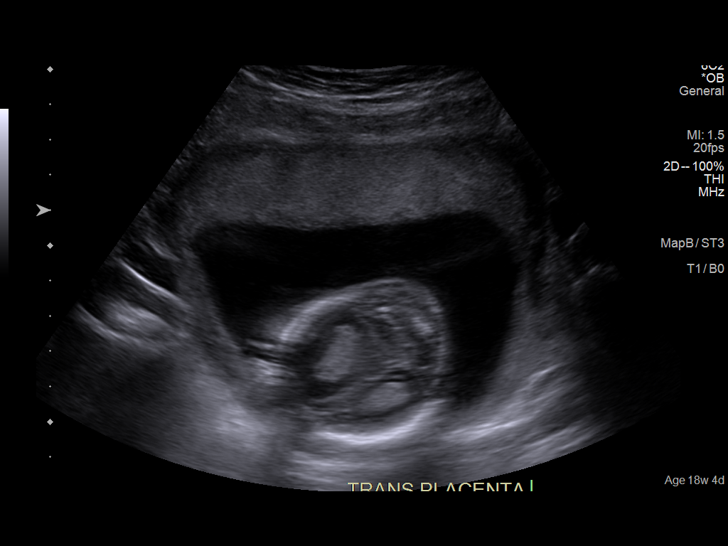
[im 17/90]
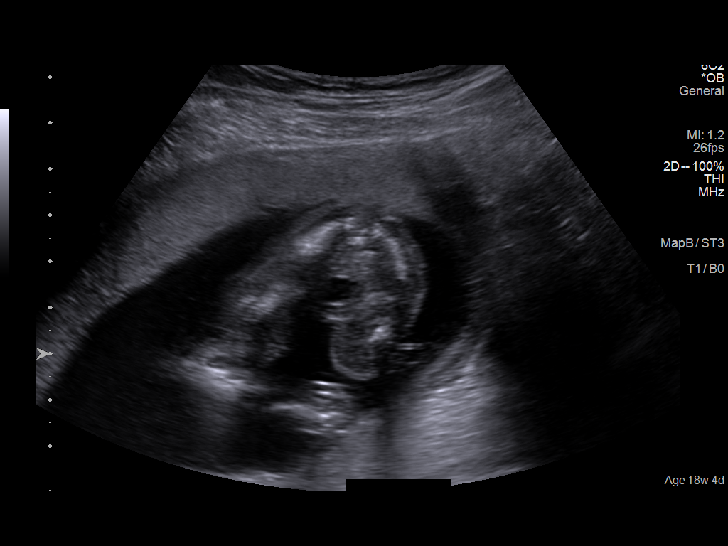
[im 27/90]
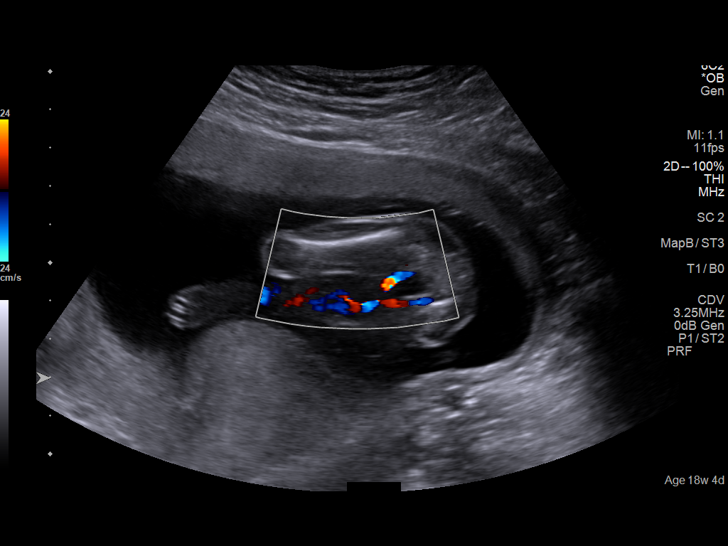
[im 33/90]
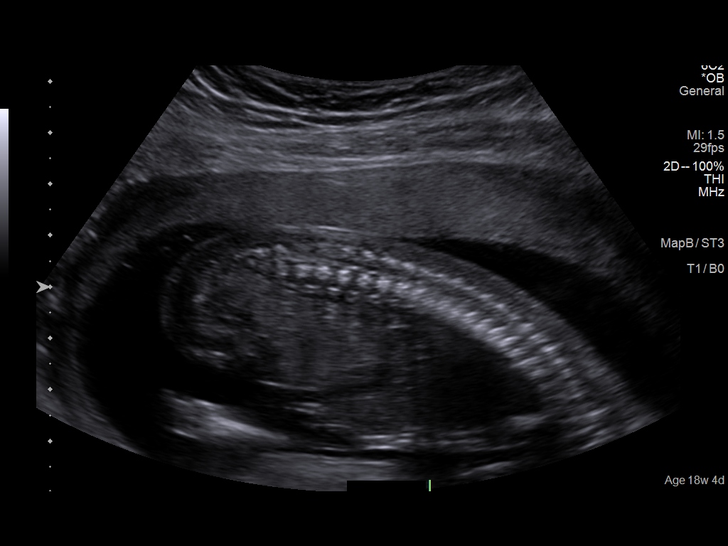
[im 40/90]
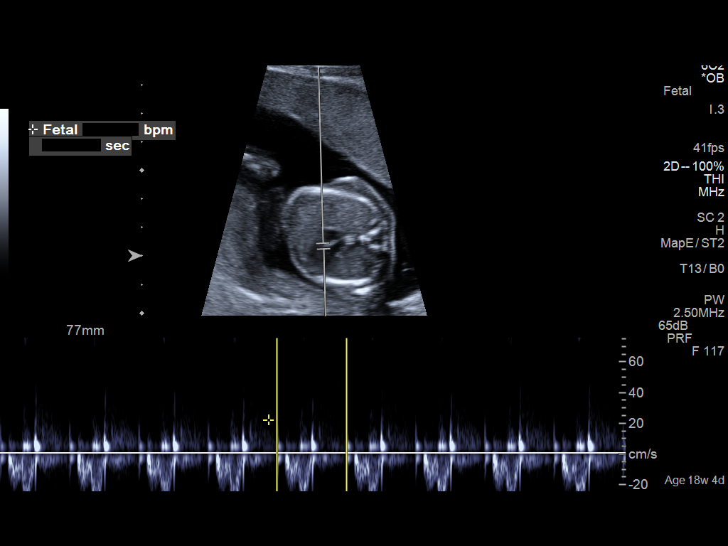
[im 50/90]
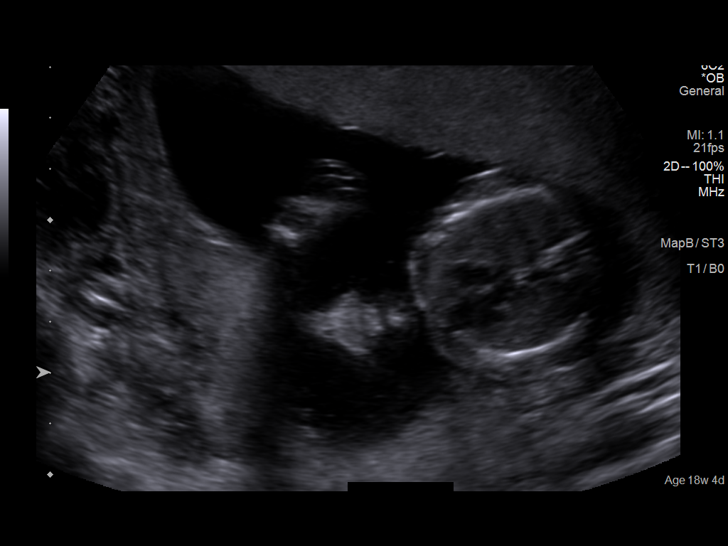
[im 57/90]
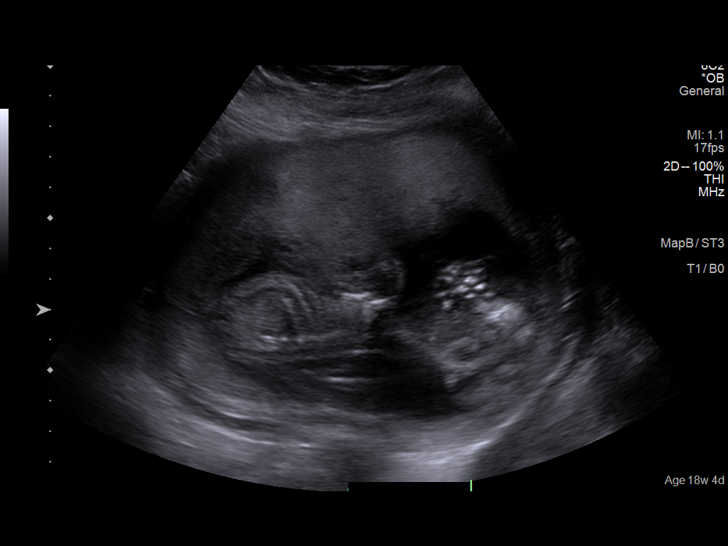
[im 63/90]
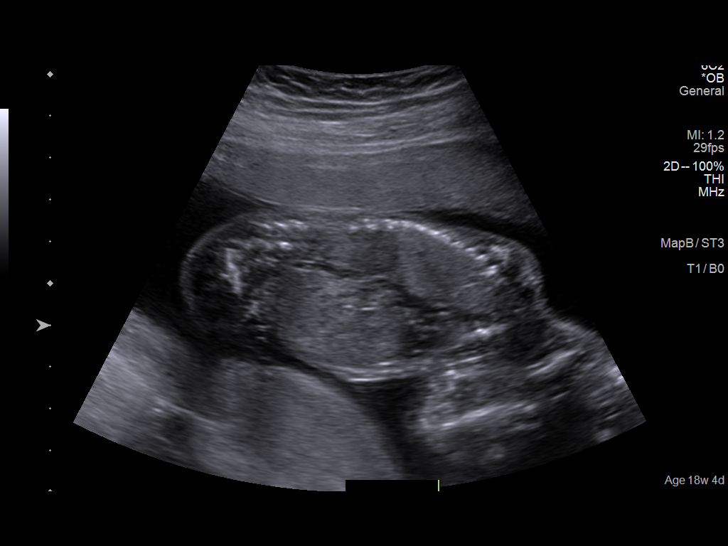
[im 73/90]
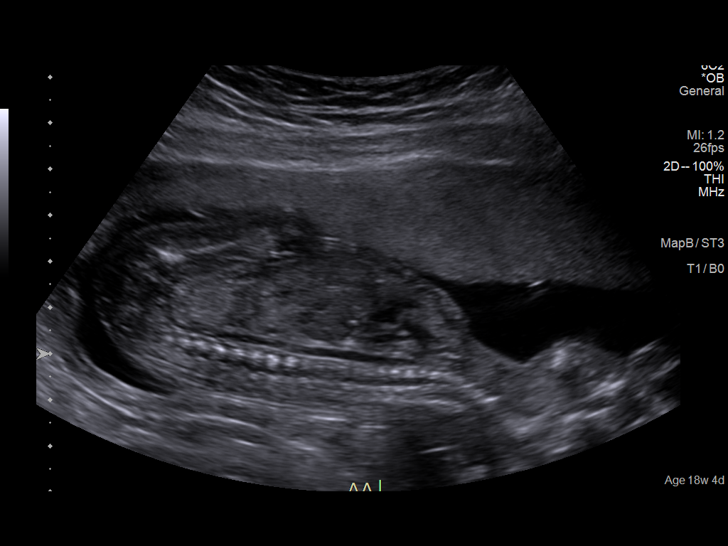
[im 80/90]
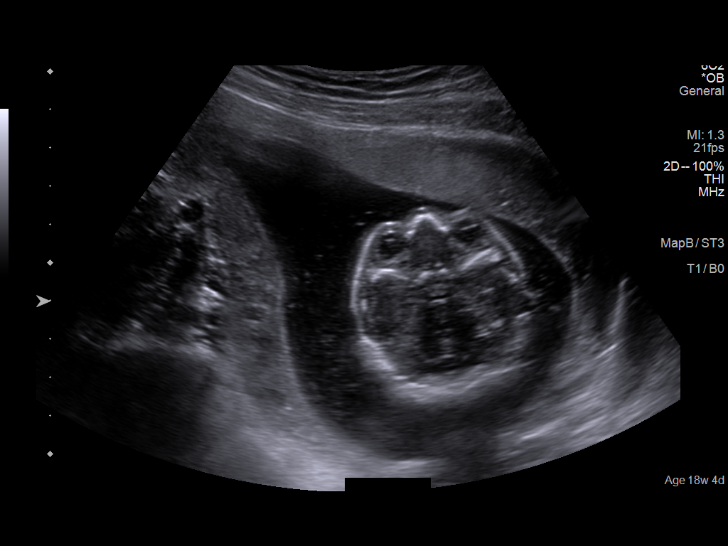
[im 86/90]
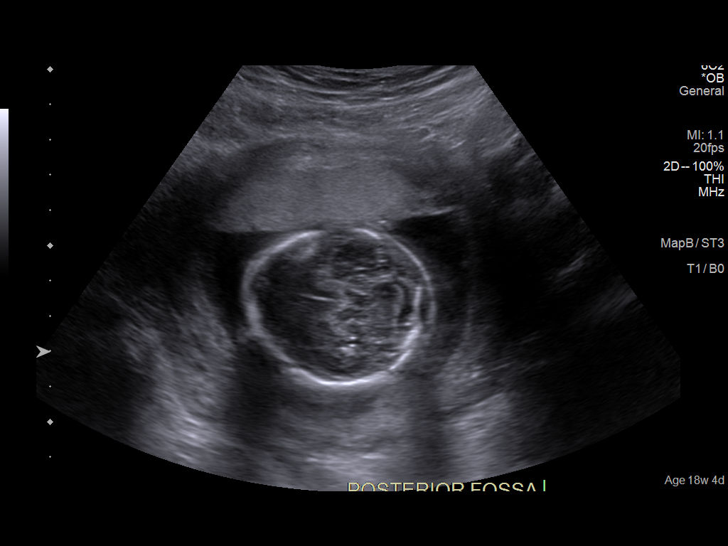

[12 of 28 positions shown; findings below may reference images not displayed]

IMPRESSION: Dear Ms  DANILSO,

 Thank you for referring your patient  for a fetal anatomical
 survey for a routine evaluation- pt underwent negative  cell
 free screening.
 She is currently at 18w 4d based in LMP consistent with
 earliest ultrasound done at [facility] [[facility]] on 06/08/19
 measuring 13w 0d ,
 There is a singleton gestation with subjectively normal
 amniotic fluid volume.

 The fetal biometry correlates with established dating.

 Evaluation of the fetal anatomy was performed.  Cervical
 spine was not well seen , other fetal anatomy appears within
 normal limits within the resolution of ultrasound as described
 above.

 Thank you for allowing us to participate in your patient's care.

 assistance.

## 2021-03-22 ENCOUNTER — Ambulatory Visit: Payer: Medicaid Other | Admitting: Internal Medicine

## 2021-05-08 ENCOUNTER — Telehealth: Payer: Self-pay | Admitting: *Deleted

## 2021-05-08 ENCOUNTER — Ambulatory Visit: Payer: Medicaid Other | Admitting: Internal Medicine

## 2021-05-08 ENCOUNTER — Encounter: Payer: Self-pay | Admitting: Internal Medicine

## 2021-05-08 VITALS — BP 108/64 | HR 93 | Temp 98.3°F | Resp 16 | Ht 64.75 in | Wt 130.0 lb

## 2021-05-08 DIAGNOSIS — F419 Anxiety disorder, unspecified: Secondary | ICD-10-CM

## 2021-05-08 DIAGNOSIS — D649 Anemia, unspecified: Secondary | ICD-10-CM | POA: Diagnosis not present

## 2021-05-08 DIAGNOSIS — B001 Herpesviral vesicular dermatitis: Secondary | ICD-10-CM

## 2021-05-08 MED ORDER — VALACYCLOVIR HCL 1 G PO TABS: 1000.0000 mg | ORAL_TABLET | Freq: Two times a day (BID) | ORAL | 1 refills | Status: AC

## 2021-05-08 MED ORDER — HYDROXYZINE HCL 10 MG PO TABS: 10.0000 mg | ORAL_TABLET | Freq: Three times a day (TID) | ORAL | 1 refills | Status: DC | PRN

## 2021-05-08 MED ORDER — ESCITALOPRAM OXALATE 5 MG PO TABS: 5.0000 mg | ORAL_TABLET | Freq: Every day | ORAL | 1 refills | Status: DC

## 2021-05-08 NOTE — Assessment & Plan Note (Signed)
Managed by Gynecology, on Iron BID.

## 2021-05-08 NOTE — Progress Notes (Signed)
New Patient Office Visit  Subjective:  Patient ID: Jamie Dodson, female    DOB: Jul 05, 2000  Age: 21 y.o. MRN: [id number]  CC:  Chief Complaint  Patient presents with   Establish Care   Mouth Lesions    Cold sores    Depression    HPI Jamie Dodson presents for as a new patient. Currently not on any daily medications. Does have a copper IUD, 2021. Has one baby girl, born in 2021. Had been on Zoloft while pregnant but stopped after delivery, because it made her feel groggy. Used to use benzos and marijuana to help deal with anxiety. Anxiety worse in the mornings. Social anxiety as well.Tried eating healthier and sleeping more. Tried counseling over Skype that didn't help. Taking iron everyday for anemia.   Anxiety: -Mood status: uncontrolled -Current treatment: No treatment  Psychotherapy/counseling: no in the past Previous psychiatric medications: zoloft Depressed mood:  sometimes Anxious mood: yes Significant weight loss or gain: no  Depression screen [facility] 2/9 05/08/2021 05/08/2021 11/18/2019 08/18/2019 07/16/2019  Decreased Interest 1 1 0 1 0  Down, Depressed, Hopeless - 1 0 1 0  PHQ - 2 Score 1 2 0 2 0  Altered sleeping - 3 2 0 -  Tired, decreased energy - 2 1 1  -  Change in appetite - 1 0 0 -  Feeling bad or failure about yourself  - 0 0 2 -  Trouble concentrating - 1 1 3  -  Moving slowly or fidgety/restless - 3 0 1 -  Suicidal thoughts - 3 0 0 -  PHQ-9 Score - 15 4 9  -  Difficult doing work/chores - Somewhat difficult - - -   Anemia: -Takes Fe BID -Managed by Gynecology  Cold Sores/HVS: -Recently had 2 out breaks -Usually takes Valtrex but out of refills  Past Medical History:  Diagnosis Date   Anemia affecting pregnancy in second trimester 09/22/2019   Anxiety    Hx anxiety/panic feelings-no med currently   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   ER note 03/08/2018   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   Ureteral reflux     Past Surgical History:   Procedure Laterality Date   kidney surgery     SKIN GRAFT     Hand    Family History  Problem Relation Age of Onset   Anxiety disorder Mother    Depression Mother    Diabetes Maternal Grandmother    Hypertension Maternal Grandmother    Hypertension Maternal Grandfather     Social History   Socioeconomic History   Marital status: Single    Spouse name: Tobe Sos   Number of children: 0   Years of education: 10   Highest education level: 10th grade  Occupational History   Not on file  Tobacco Use   Smoking status: Former   Smokeless tobacco: Never  Scientific laboratory technician Use: Some days   Substances: Nicotine  Substance and Sexual Activity   Alcohol use: Yes    Comment: occasional   Drug use: Yes    Types: Marijuana    Comment: Occ. use before known pregnant   Sexual activity: Yes    Birth control/protection: Condom, Pill, Implant    Comment: Last O.C.'s-3 yrs. ago  Other Topics Concern   Not on file  Social History Narrative   Patient lives with her finance and reports a supportive relationship of 3 years. She reports having one best friend and having a close relationship with her parents. She is  currently unemployed and going to school online to obtain her high school diploma.    Social Determinants of Health   Financial Resource Strain: Not on file  Food Insecurity: Not on file  Transportation Needs: Not on file  Physical Activity: Not on file  Stress: Not on file  Social Connections: Not on file  Intimate Partner Violence: Not on file    ROS Review of Systems  Constitutional:  Negative for appetite change, chills and fever.  Eyes:  Negative for visual disturbance.  Respiratory:  Negative for cough.   Cardiovascular:  Negative for chest pain.  Gastrointestinal:  Negative for abdominal pain, constipation and diarrhea.  Psychiatric/Behavioral:  The patient is nervous/anxious.    Objective:   Today's Vitals: BP 108/64    Pulse 93    Temp 98.3 F (36.8 C)     Resp 16    Ht 5' 4.75" (1.645 m)    Wt 130 lb (59 kg)    LMP 04/26/2021    SpO2 99%    BMI 21.80 kg/m   Physical Exam Constitutional:      Appearance: Normal appearance.  HENT:     Head: Normocephalic and atraumatic.     Mouth/Throat:     Mouth: Mucous membranes are moist.     Pharynx: Oropharynx is clear.     Comments: One cold sore almost healed on left lower lip Eyes:     Conjunctiva/sclera: Conjunctivae normal.  Cardiovascular:     Rate and Rhythm: Normal rate and regular rhythm.  Pulmonary:     Effort: Pulmonary effort is normal.     Breath sounds: Normal breath sounds.  Abdominal:     General: There is no distension.     Palpations: Abdomen is soft.     Tenderness: There is no abdominal tenderness.  Musculoskeletal:     Right lower leg: No edema.     Left lower leg: No edema.  Skin:    General: Skin is warm and dry.  Neurological:     General: No focal deficit present.     Mental Status: She is alert. Mental status is at baseline.  Psychiatric:        Mood and Affect: Mood normal.        Behavior: Behavior normal.    Assessment & Plan:   Problem List Items Addressed This Visit       Digestive   Cold sore    Refilled Valtrex, discussed when best to start medication.      Relevant Medications   valACYclovir (VALTREX) 1000 MG tablet     Other   Anemia    Managed by Gynecology, on Iron BID.       Anxiety - Primary    Referral placed to [facility] to help with finding a Social worker. Start Lexapro 5 mg daily as well as Hydroxyzine PRN. Follow up in 6 weeks for recheck.      Relevant Medications   escitalopram (LEXAPRO) 5 MG tablet   hydrOXYzine (ATARAX) 10 MG tablet   Other Relevant Orders   AMB Referral to [place]    Outpatient Encounter Medications as of 05/08/2021  Medication Sig   paragard intrauterine copper IUD IUD by Intrauterine route.   [DISCONTINUED] acetaminophen (TYLENOL) 325 MG tablet Take 2 tablets (650 mg  total) by mouth every 4 (four) hours as needed (for pain scale < 4).   [DISCONTINUED] ferrous sulfate 324 (65 Fe) MG TBEC Take 1 tablet (325 mg total) by mouth daily.   [  DISCONTINUED] ibuprofen (ADVIL) 600 MG tablet Take 1 tablet (600 mg total) by mouth every 6 (six) hours as needed for mild pain or moderate pain.   [DISCONTINUED] Prenatal Vit-Fe Fumarate-FA (MULTIVITAMIN-PRENATAL) 27-0.8 MG TABS tablet Take 1 tablet by mouth daily at 12 noon.   [DISCONTINUED] sertraline (ZOLOFT) 50 MG tablet Take 1.5 tablets (75 mg total) by mouth daily.   No facility-administered encounter medications on file as of 05/08/2021.    Follow-up: Return in about 6 weeks (around 06/19/2021).   Teodora Medici, DO

## 2021-05-08 NOTE — Patient Instructions (Addendum)
It was great seeing you today!  Plan discussed at today's visit: -Lexapro 5 mg, start taking every day, will take 4-6 weeks for full effect. If you stop it for any reason please just let me know -Hydroxyzine as needed for anxiety, will make you tired -Valtrex for cold sores - try to take before rash/sore erupts  -Referral placed for community services to call you to set up counseling   Follow up in: 6 weeks  Take care and let us know if you have any questions or concerns prior to your next visit.  Dr. Rosana Berger

## 2021-05-08 NOTE — Assessment & Plan Note (Signed)
Refilled Valtrex, discussed when best to start medication.

## 2021-05-08 NOTE — Assessment & Plan Note (Signed)
Referral placed to [facility] to help with finding a Veterinary surgeon. Start Lexapro 5 mg daily as well as Hydroxyzine PRN. Follow up in 6 weeks for recheck.

## 2021-05-08 NOTE — Chronic Care Management (AMB) (Signed)
°  Care Management   Outreach Note  05/08/2021 Name: Jamie Dodson MRN: [id number] DOB: 05-22-2000  Referred by: Margarita Mail, DO Reason for referral : Care Coordination (Initial outreach to schedule referral with Licensed Clinical SW)   An unsuccessful telephone outreach was attempted today. The patient was referred to the case management team for assistance with care management and care coordination.   Follow Up Plan:  A HIPAA compliant phone message was left for the patient providing contact information and requesting a return call.  If patient returns call to provider office, please advise to call Embedded Care Management Care Guide Diontae Route at [phone]  Burman Nieves, CCMA Care Guide, Embedded Care Coordination [facility]   Care Management  Direct Dial: [phone]

## 2021-05-15 NOTE — Chronic Care Management (AMB) (Signed)
°  Care Management   Outreach Note  05/15/2021 Name: Jamie Dodson MRN: [id number] DOB: [birth year]  Referred by: Margarita Mail, DO Reason for referral : Care Coordination (Initial outreach to schedule referral with Licensed Clinical SW)   A second unsuccessful telephone outreach was attempted today. The patient was referred to the case management team for assistance with care management and care coordination.   Follow Up Plan:  A HIPAA compliant phone message was left for the patient providing contact information and requesting a return call.  If patient returns call to provider office, please advise to call Embedded Care Management Care Guide Danita Proud at [phone]  Burman Nieves, CCMA Care Guide, Embedded Care Coordination [facility]   Care Management  Direct Dial: [phone]

## 2021-05-22 NOTE — Chronic Care Management (AMB) (Signed)
?  Care Management  ? ?Outreach Note ? ?05/22/2021 ?Name: JOYELLE SIEDLECKI MRN: [id number] DOB: 2000/10/09 ? ?Referred by: Margarita Mail, DO ?Reason for referral : Care Coordination (Initial outreach to schedule referral with Licensed Clinical SW) ? ? ?Third unsuccessful telephone outreach was attempted today. The patient was referred to the case management team for assistance with care management and care coordination. The patient's primary care provider has been notified of our unsuccessful attempts to make or maintain contact with the patient. The care management team is pleased to engage with this patient at any time in the future should he/she be interested in assistance from the care management team.  ? ?Follow Up Plan:  ?We have been unable to make contact with the patient for follow up. The care management team is available to follow up with the patient after provider conversation with the patient regarding recommendation for care management engagement and subsequent re-referral to the care management team.  ? ?Kyla Duffy, CCMA ?Care Guide, Embedded Care Coordination ?[facility]  Care Management  ?Direct Dial: [phone] ? ? ?

## 2021-05-30 ENCOUNTER — Other Ambulatory Visit: Payer: Self-pay | Admitting: Internal Medicine

## 2021-05-30 DIAGNOSIS — F419 Anxiety disorder, unspecified: Secondary | ICD-10-CM

## 2021-05-30 NOTE — Telephone Encounter (Signed)
Requested medication (s) are due for refill today: has one refill left ? ?Requested medication (s) are on the active medication list: yes ? ?Last refill:  05/08/21 #30 with 1 RF ? ?Future visit scheduled: 06/19/21 ? ?Notes to clinic:  Pharm requesting 90 day, OV note stated trying this med, has appt coming up, please assess. ? ? ?  ? ?Requested Prescriptions  ?Pending Prescriptions Disp Refills  ? escitalopram (LEXAPRO) 5 MG tablet [Pharmacy Med Name: ESCITALOPRAM 5 MG TABLET] 90 tablet 1  ?  Sig: Take 1 tablet (5 mg total) by mouth daily.  ?  ? Psychiatry:  Antidepressants - SSRI Passed - 05/30/2021 10:31 AM  ?  ?  Passed - Completed PHQ-2 or PHQ-9 in the last 360 days  ?  ?  Passed - Valid encounter within last 6 months  ?  Recent Outpatient Visits   ? ?      ? 3 weeks ago Anxiety  ? [facility] Margarita Mail, DO  ? 3 years ago Anxiety  ? [facility] Lada, Janit Bern, MD  ? 3 years ago Fever blister  ? [facility] Poulose, Lanora Manis E, NP  ? 4 years ago Syncope, unspecified syncope type  ? [facility] Lada, Janit Bern, MD  ? 5 years ago Need for HPV vaccination  ? [facility] Lada, Janit Bern, MD  ? ?  ?  ?Future Appointments   ? ?        ? In 2 weeks Margarita Mail, DO [facility]  ? ?  ? ?  ?  ?  ? ? ?

## 2021-06-06 ENCOUNTER — Ambulatory Visit: Payer: Self-pay | Admitting: Internal Medicine

## 2021-06-19 ENCOUNTER — Ambulatory Visit: Payer: Medicaid Other | Admitting: Internal Medicine

## 2021-06-19 ENCOUNTER — Encounter: Payer: Self-pay | Admitting: Internal Medicine

## 2021-06-19 VITALS — BP 116/72 | HR 82 | Temp 97.6°F | Resp 16 | Ht 64.75 in | Wt 130.8 lb

## 2021-06-19 DIAGNOSIS — F419 Anxiety disorder, unspecified: Secondary | ICD-10-CM

## 2021-06-19 NOTE — Progress Notes (Signed)
? ?New Patient Office Visit ? ?Subjective:  ?Patient ID: Jamie Dodson, female    DOB: 2000/03/30  Age: 21 y.o. MRN: [id number] ? ?CC:  ?Chief Complaint  ?Patient presents with  ? Follow-up  ? Anxiety  ? ? ?HPI ?Jamie Dodson presents for follow up. ? ?Anxiety: ?-Mood status: controlled  ?-Current treatment: Lexapro 5 mg started at LOV and Hydroxyzine PRN ?-Doing much better on medication, compliant and reporting no side effects ?-Hasn't had to use the Hydroxyzine  ?Psychotherapy/counseling: no in the past ?Previous psychiatric medications: zoloft (made groggy) ?Depressed mood:  sometimes ?Anxious mood: yes ?Significant weight loss or gain: no ? ? ?  06/19/2021  ?  1:14 PM 05/08/2021  ?  9:24 AM 05/08/2021  ?  9:21 AM 11/18/2019  ?  8:51 AM 08/18/2019  ? 12:01 PM  ?Depression screen PHQ 2/9  ?Decreased Interest 1 1 1  0 1  ?Down, Depressed, Hopeless 0  1 0 1  ?PHQ - 2 Score 1 1 2  0 2  ?Altered sleeping 0  3 2 0  ?Tired, decreased energy 0  2 1 1   ?Change in appetite 0  1 0 0  ?Feeling bad or failure about yourself  0  0 0 2  ?Trouble concentrating 0  1 1 3   ?Moving slowly or fidgety/restless 0  3 0 1  ?Suicidal thoughts 0  3 0 0  ?PHQ-9 Score 1  15 4 9   ?Difficult doing work/chores Not difficult at all  Somewhat difficult    ? ?Anemia: ?-Takes Fe BID ?-Managed by Gynecology ?-Recently had change in contraception by gynecology - had been on patches for 1.5 weeks and had bad nausea, now on gel nad doing much better  ? ?Cold Sores/HVS: ?-No outbreaks in the last 2 months about  ?-Valtrex PRN ? ?Past Medical History:  ?Diagnosis Date  ? Anemia affecting pregnancy in second trimester 09/22/2019  ? Anxiety   ? Hx anxiety/panic feelings-no med currently  ? 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 ER note 03/08/2018  ? 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 Ureteral reflux   ? ? ?Past Surgical History:  ?Procedure Laterality Date  ? kidney surgery    ? SKIN GRAFT    ? Hand  ? ? ?Family History  ?Problem Relation Age of Onset  ?  Anxiety disorder Mother   ? Depression Mother   ? Diabetes Maternal Grandmother   ? Hypertension Maternal Grandmother   ? Hypertension Maternal Grandfather   ? ? ?Social History  ? ?Socioeconomic History  ? Marital status: Single  ?  Spouse name:  ? Number of children: 0  ? Years of education: 10  ? Highest education level: 10th grade  ?Occupational History  ? Not on file  ?Tobacco Use  ? Smoking status: Former  ? Smokeless tobacco: Never  ?Vaping Use  ? Vaping Use: Some days  ? Substances: Nicotine  ?Substance and Sexual Activity  ? Alcohol use: Yes  ?  Comment: occasional  ? Drug use: Yes  ?  Types: Marijuana  ?  Comment: Occ. use before known pregnant  ? Sexual activity: Yes  ?  Birth control/protection: Condom, Pill, Implant  ?  Comment: Last O.C.'s-3 yrs. ago  ?Other Topics Concern  ? Not on file  ?Social History Narrative  ? Patient lives with her finance and reports a supportive relationship of 3 years. She reports having one best friend and having a close relationship with her parents. She is currently unemployed  and going to school online to obtain her high school diploma.   ? ?Social Determinants of Health  ? ?Financial Resource Strain: Not on file  ?Food Insecurity: Not on file  ?Transportation Needs: Not on file  ?Physical Activity: Not on file  ?Stress: Not on file  ?Social Connections: Not on file  ?Intimate Partner Violence: Not on file  ? ? ?ROS ?Review of Systems  ?Constitutional:  Negative for appetite change, chills and fever.  ?Respiratory:  Negative for cough.   ?Cardiovascular:  Negative for chest pain.  ?Gastrointestinal:  Negative for abdominal pain, nausea and vomiting.  ?Psychiatric/Behavioral:  The patient is not nervous/anxious.   ? ?Objective:  ? ?Today's Vitals: BP 116/72   Pulse 82   Temp 97.6 ?F (36.4 ?C)   Resp 16   Ht 5' 4.75" (1.645 m)   Wt 130 lb 12.8 oz (59.3 kg)   SpO2 98%   BMI 21.93 kg/m?  ? ? ?Physical Exam ?Constitutional:   ?   Appearance: Normal appearance.   ?HENT:  ?   Head: Normocephalic and atraumatic.  ?   Mouth/Throat:  ?   Comments: One cold sore almost healed on left lower lip ?Eyes:  ?   Conjunctiva/sclera: Conjunctivae normal.  ?Cardiovascular:  ?   Rate and Rhythm: Normal rate and regular rhythm.  ?Pulmonary:  ?   Effort: Pulmonary effort is normal.  ?   Breath sounds: Normal breath sounds.  ?Musculoskeletal:  ?   Right lower leg: No edema.  ?   Left lower leg: No edema.  ?Skin: ?   General: Skin is warm and dry.  ?Neurological:  ?   General: No focal deficit present.  ?   Mental Status: She is alert. Mental status is at baseline.  ?Psychiatric:     ?   Mood and Affect: Mood normal.     ?   Behavior: Behavior normal.  ? ? ?Assessment & Plan:  ? ?Problem List Items Addressed This Visit   ? ?  ? Other  ? Anxiety - Primary  ?  Doing much better on Lexapro 5 mg, keep dose the same and follow up in 5 months for recheck. Continue Hydroxyzine as needed.  ?  ?  ? ?Outpatient Encounter Medications as of 06/19/2021  ?Medication Sig  ? escitalopram (LEXAPRO) 5 MG tablet TAKE 1 TABLET (5 MG TOTAL) BY MOUTH DAILY.  ? hydrOXYzine (ATARAX) 10 MG tablet Take 1 tablet (10 mg total) by mouth 3 (three) times daily as needed for anxiety.  ? Lactic Ac-Citric Ac-Pot Bitart (PHEXXI) 1.8-1-0.4 % GEL Place vaginally.  ? paragard intrauterine copper IUD IUD by Intrauterine route.  ? ?No facility-administered encounter medications on file as of 06/19/2021.  ? ? ?Follow-up: Return in about 5 months (around 11/19/2021).  ? ?Margarita Mail, DO ? ?

## 2021-06-19 NOTE — Patient Instructions (Addendum)
It was great seeing you today! ? ?Plan discussed at today's visit: ?-Continue medication at current dose and can take Hydroxyzine as needed  ?-If you feel like dose is no longer working well for you, please send me a message or give [place] a call to get you seen sooner  ? ?Follow up in: 6 months  ? ?Take care and let us know if you have any questions or concerns prior to your next visit. ? ?Dr. Rosana Berger ? ?

## 2021-06-19 NOTE — Assessment & Plan Note (Signed)
Doing much better on Lexapro 5 mg, keep dose the same and follow up in 5 months for recheck. Continue Hydroxyzine as needed.  ?

## 2021-11-15 ENCOUNTER — Other Ambulatory Visit: Payer: Self-pay | Admitting: Internal Medicine

## 2021-11-15 DIAGNOSIS — F419 Anxiety disorder, unspecified: Secondary | ICD-10-CM

## 2021-11-15 NOTE — Telephone Encounter (Signed)
Medication Refill - Medication:  escitalopram (LEXAPRO) 5 MG tablet 90 tablet 1 05/30/2021    Sig - Route: TAKE 1 TABLET (5 MG TOTAL) BY MOUTH DAILY. - Oral   Pt states that she was told by Dr A that would need another appt before refill but states that she spilt some and she is going to be out and she is not supposed to come off of this med. Only has a couple piils left. Request early refill   Has the patient contacted their pharmacy? No. (Agent: If no, request that the patient contact the pharmacy for the refill. If patient does not wish to contact the pharmacy document the reason why and proceed with request.) too early (Agent: If yes, when and what did the pharmacy advise?)  Preferred Pharmacy (with phone number or street name):  [id number] - [place], [place] - [address]  [address] [place] [address]  Phone: [phone] Fax: [phone]     Has the patient been seen for an appointment in the last year OR does the patient have an upcoming appointment? Yes.    Agent: Please be advised that RX refills may take up to 3 business days. We ask that you follow-up with your pharmacy.   escitalopram (LEXAPRO) 5 MG tablet 90 tablet 1 05/30/2021    Sig - Route: TAKE 1 TABLET (5 MG TOTAL) BY MOUTH DAILY. - Oral

## 2021-11-16 ENCOUNTER — Other Ambulatory Visit: Payer: Self-pay | Admitting: Internal Medicine

## 2021-11-16 DIAGNOSIS — F419 Anxiety disorder, unspecified: Secondary | ICD-10-CM

## 2021-11-16 MED ORDER — ESCITALOPRAM OXALATE 5 MG PO TABS: 5.0000 mg | ORAL_TABLET | Freq: Every day | ORAL | 0 refills | Status: DC

## 2021-11-16 NOTE — Telephone Encounter (Signed)
Requested medication (s) are due for refill today - no  Requested medication (s) are on the active medication list -yes  Future visit scheduled -yes  Last refill: 05/30/21 #90 1RF  Notes to clinic: patient request early RF- or extended Rx to get to appointment- out of pills- see notes  Requested Prescriptions  Pending Prescriptions Disp Refills   escitalopram (LEXAPRO) 5 MG tablet 90 tablet 1    Sig: Take 1 tablet (5 mg total) by mouth daily.     Psychiatry:  Antidepressants - SSRI Passed - 11/15/2021  5:06 PM      Passed - Completed PHQ-2 or PHQ-9 in the last 360 days      Passed - Valid encounter within last 6 months    Recent Outpatient Visits           5 months ago Anxiety   [facility] Margarita Mail, DO   6 months ago Anxiety   [facility] Margarita Mail, DO   3 years ago Anxiety   [facility], Janit Bern, MD   3 years ago Fever blister   [facility] Cheryle Horsfall, NP   5 years ago Syncope, unspecified syncope type   [facility] [place], Janit Bern, MD       Future Appointments             In 1 week Margarita Mail, DO [facility]               Requested Prescriptions  Pending Prescriptions Disp Refills   escitalopram (LEXAPRO) 5 MG tablet 90 tablet 1    Sig: Take 1 tablet (5 mg total) by mouth daily.     Psychiatry:  Antidepressants - SSRI Passed - 11/15/2021  5:06 PM      Passed - Completed PHQ-2 or PHQ-9 in the last 360 days      Passed - Valid encounter within last 6 months    Recent Outpatient Visits           5 months ago Anxiety   [facility] Margarita Mail, DO   6 months ago Anxiety   [facility] Margarita Mail, DO   3 years ago Anxiety   [facility] Lada, Janit Bern, MD   3 years ago Fever blister   [facility]  Cheryle Horsfall, NP   5 years ago Syncope, unspecified syncope type   [facility] L[facility], Janit Bern, MD       Future Appointments             In 1 week Margarita Mail, DO [facility], [facility]

## 2021-11-17 NOTE — Telephone Encounter (Signed)
Requested medication (s) are due for refill today: no  Requested medication (s) are on the active medication list: yes  Last refill:  11/16/21  Future visit scheduled:yes  Notes to clinic:  Unable to refill per protocol, last refill by provider 11/16/21, possible duplicate.     Requested Prescriptions  Pending Prescriptions Disp Refills   escitalopram (LEXAPRO) 5 MG tablet [Pharmacy Med Name: ESCITALOPRAM 5 MG TABLET] 90 tablet 1    Sig: Take 1 tablet (5 mg total) by mouth daily.     Psychiatry:  Antidepressants - SSRI Passed - 11/16/2021  2:46 PM      Passed - Completed PHQ-2 or PHQ-9 in the last 360 days      Passed - Valid encounter within last 6 months    Recent Outpatient Visits           5 months ago Anxiety   [facility] Margarita Mail, DO   6 months ago Anxiety   [facility] Margarita Mail, DO   3 years ago Anxiety   [facility]ada, Janit Bern, MD   3 years ago Fever blister   [facility] Cheryle Horsfall, NP   5 years ago Syncope, unspecified syncope type   [facility], Janit Bern, MD       Future Appointments             In 1 week Margarita Mail, DO [facility], [facility]

## 2021-11-27 ENCOUNTER — Encounter: Payer: Self-pay | Admitting: Internal Medicine

## 2021-11-27 ENCOUNTER — Ambulatory Visit: Payer: Medicaid Other | Admitting: Internal Medicine

## 2021-11-27 VITALS — BP 114/74 | HR 97 | Temp 97.9°F | Resp 16 | Ht 64.75 in | Wt 132.1 lb

## 2021-11-27 DIAGNOSIS — Z13228 Encounter for screening for other metabolic disorders: Secondary | ICD-10-CM

## 2021-11-27 DIAGNOSIS — F419 Anxiety disorder, unspecified: Secondary | ICD-10-CM | POA: Diagnosis not present

## 2021-11-27 DIAGNOSIS — Z13220 Encounter for screening for lipoid disorders: Secondary | ICD-10-CM

## 2021-11-27 DIAGNOSIS — D649 Anemia, unspecified: Secondary | ICD-10-CM | POA: Diagnosis not present

## 2021-11-27 MED ORDER — ESCITALOPRAM OXALATE 10 MG PO TABS: 10.0000 mg | ORAL_TABLET | Freq: Every day | ORAL | 1 refills | Status: DC

## 2021-11-27 NOTE — Patient Instructions (Addendum)
It was great seeing you today!  Plan discussed at today's visit: -Blood work ordered today, results will be uploaded to MyChart.  -Lexapro increased to 10 mg daily, continue Hydroxyzine as needed -Recommend Melatonin 3 mg at night to help fall asleep   Follow up in: 6 weeks   Take care and let us know if you have any questions or concerns prior to your next visit.  Dr. Caralee Ates

## 2021-11-27 NOTE — Progress Notes (Signed)
New Patient Office Visit  Subjective:  Patient ID: Jamie Dodson, female    DOB: [birth year]  Age: 21 y.o. MRN: [id number]  CC:  Chief Complaint  Patient presents with   Follow-up   Anxiety    Pt would like to increase dose if possible since the past month anxiety has been more frequent especially at night times.    HPI Jamie Dodson presents for follow up.   Anxiety: -Mood status: worse - having difficulty sleeping. Has a 65 year old baby but patient having a hard time falling asleep - goes to bed around midnight, working on better sleep schedule but anxiety keeping her up at night.  -Current treatment: Lexapro 5 mg, Hydroxyzine PRN -Doing much better on medication, compliant and reporting no side effects Psychotherapy/counseling: no in the past Previous psychiatric medications: zoloft (made groggy) Depressed mood:  sometimes Anxious mood: yes Significant weight loss or gain: no     11/27/2021    1:10 PM 06/19/2021    1:14 PM 05/08/2021    9:24 AM 05/08/2021    9:21 AM 11/18/2019    8:51 AM  Depression screen PHQ 2/9  Decreased Interest 1 1 1 1  0  Down, Depressed, Hopeless 1 0  1 0  PHQ - 2 Score 2 1 1 2  0  Altered sleeping 1 0  3 2  Tired, decreased energy 1 0  2 1  Change in appetite 1 0  1 0  Feeling bad or failure about yourself  0 0  0 0  Trouble concentrating 1 0  1 1  Moving slowly or fidgety/restless 0 0  3 0  Suicidal thoughts 0 0  3 0  PHQ-9 Score 6 1  15 4   Difficult doing work/chores Somewhat difficult Not difficult at all  Somewhat difficult    Anemia: -Managed by Gynecology, had been on iron supplements when she was pregnant, not on anymore  -Recently had change in contraception by gynecology - had a copper IUD but was having pain so that was removed   Cold Sores/HVS: -No outbreaks in the last 2 months about  -Valtrex PRN  Health Maintenance: -Blood work due   Past Medical History:  Diagnosis Date   Anemia affecting pregnancy in second trimester  09/22/2019   Anxiety    Hx anxiety/panic feelings-no med currently   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   ER note 03/08/2018   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   Ureteral reflux     Past Surgical History:  Procedure Laterality Date   kidney surgery     SKIN GRAFT     Hand    Family History  Problem Relation Age of Onset   Anxiety disorder Mother    Depression Mother    Diabetes Maternal Grandmother    Hypertension Maternal Grandmother    Hypertension Maternal Grandfather     Social History   Socioeconomic History   Marital status: Single    Spouse name: 03/10/2018   Number of children: 0   Years of education: 10   Highest education level: 10th grade  Occupational History   Not on file  Tobacco Use   Smoking status: Former   Smokeless tobacco: Never  03/10/2018 Use: Some days   Substances: Nicotine  Substance and Sexual Activity   Alcohol use: Yes    Comment: occasional   Drug use: Yes    Types: Marijuana    Comment: Occ. use before known pregnant   Sexual activity:  Yes    Birth control/protection: Condom, Pill, Implant    Comment: Last O.C.'s-3 yrs. ago  Other Topics Concern   Not on file  Social History Narrative   Patient lives with her finance and reports a supportive relationship of 3 years. She reports having one best friend and having a close relationship with her parents. She is currently unemployed and going to school online to obtain her high school diploma.    Social Determinants of Health   Financial Resource Strain: Not on file  Food Insecurity: No Food Insecurity (05/26/2019)   Hunger Vital Sign    Worried About Running Out of Food in the Last Year: Never true    Ran Out of Food in the Last Year: Never true  Transportation Needs: No Transportation Needs (05/26/2019)   PRAPARE - Administrator, Civil Service (Medical): No    Lack of Transportation (Non-Medical): No  Physical Activity: Not on file  Stress: Not on  file  Social Connections: Not on file  Intimate Partner Violence: Not At Risk (05/26/2019)   Humiliation, Afraid, Rape, and Kick questionnaire    Fear of Current or Ex-Partner: No    Emotionally Abused: No    Physically Abused: No    Sexually Abused: No    ROS Review of Systems  Constitutional:  Negative for appetite change, chills and fever.  Respiratory:  Negative for cough.   Cardiovascular:  Negative for chest pain.  Gastrointestinal:  Negative for abdominal pain, nausea and vomiting.  Psychiatric/Behavioral:  Positive for sleep disturbance. The patient is nervous/anxious.     Objective:   Today's Vitals: BP 114/74   Pulse 97   Temp 97.9 F (36.6 C) (Oral)   Resp 16   Ht 5' 4.75" (1.645 m)   Wt 132 lb 1.6 oz (59.9 kg)   LMP 11/05/2021 (Exact Date)   SpO2 98%   Breastfeeding No   BMI 22.15 kg/m    Physical Exam Constitutional:      Appearance: Normal appearance.  HENT:     Head: Normocephalic and atraumatic.     Mouth/Throat:     Comments: One cold sore almost healed on left lower lip Eyes:     Conjunctiva/sclera: Conjunctivae normal.  Cardiovascular:     Rate and Rhythm: Normal rate and regular rhythm.  Pulmonary:     Effort: Pulmonary effort is normal.     Breath sounds: Normal breath sounds.  Musculoskeletal:     Right lower leg: No edema.     Left lower leg: No edema.  Skin:    General: Skin is warm and dry.  Neurological:     General: No focal deficit present.     Mental Status: She is alert. Mental status is at baseline.  Psychiatric:        Mood and Affect: Mood normal.        Behavior: Behavior normal.     Assessment & Plan:   1. Anxiety: Worse, having difficulty sleeping. Increase Lexapro to 10 mg daily. Recommend Melatonin 3 mg over the counter to help with sleep. Follow up in 6 weeks to recheck.   - COMPLETE METABOLIC PANEL WITH GFR - escitalopram (LEXAPRO) 10 MG tablet; Take 1 tablet (10 mg total) by mouth daily.  Dispense: 30 tablet;  Refill: 1  2. Anemia, unspecified type: Had been on iron supplements when she was pregnant but not currently. Recheck CBC today.   - CBC w/Diff/Platelet  3. Screening for metabolic disorder: Check CMP today  for screening.   - COMPLETE METABOLIC PANEL WITH GFR  4. Lipid screening: Screening labs due.   - Lipid Profile  Follow-up: Return in about 6 weeks (around 01/08/2022).   Margarita Mail, DO

## 2021-11-28 LAB — COMPLETE METABOLIC PANEL WITH GFR
AG Ratio: 1.8 (calc) (ref 1.0–2.5)
ALT: 13 U/L (ref 6–29)
AST: 20 U/L (ref 10–30)
Albumin: 4.8 g/dL (ref 3.6–5.1)
Alkaline phosphatase (APISO): 39 U/L (ref 31–125)
BUN/Creatinine Ratio: 15 (calc) (ref 6–22)
BUN: 15 mg/dL (ref 7–25)
CO2: 25 mmol/L (ref 20–32)
Calcium: 9.4 mg/dL (ref 8.6–10.2)
Chloride: 105 mmol/L (ref 98–110)
Creat: 1 mg/dL — ABNORMAL HIGH (ref 0.50–0.96)
Globulin: 2.6 g/dL (calc) (ref 1.9–3.7)
Glucose, Bld: 76 mg/dL (ref 65–99)
Potassium: 4.2 mmol/L (ref 3.5–5.3)
Sodium: 140 mmol/L (ref 135–146)
Total Bilirubin: 0.4 mg/dL (ref 0.2–1.2)
Total Protein: 7.4 g/dL (ref 6.1–8.1)
eGFR: 82 mL/min/{1.73_m2} (ref >=60)

## 2021-11-28 LAB — CBC WITH DIFFERENTIAL/PLATELET
Absolute Monocytes: 479 cells/uL (ref 200–950)
Basophils Absolute: 34 cells/uL (ref 0–200)
Basophils Relative: 0.4 %
Eosinophils Absolute: 370 cells/uL (ref 15–500)
Eosinophils Relative: 4.4 %
HCT: 39.1 % (ref 35.0–45.0)
Hemoglobin: 13 g/dL (ref 11.7–15.5)
Lymphs Abs: 2671 cells/uL (ref 850–3900)
MCH: 30 pg (ref 27.0–33.0)
MCHC: 33.2 g/dL (ref 32.0–36.0)
MCV: 90.3 fL (ref 80.0–100.0)
MPV: 11 fL (ref 7.5–12.5)
Monocytes Relative: 5.7 %
Neutro Abs: 4847 cells/uL (ref 1500–7800)
Neutrophils Relative %: 57.7 %
Platelets: 208 10*3/uL (ref 140–400)
RBC: 4.33 10*6/uL (ref 3.80–5.10)
RDW: 12.6 % (ref 11.0–15.0)
Total Lymphocyte: 31.8 %
WBC: 8.4 10*3/uL (ref 3.8–10.8)

## 2021-11-28 LAB — LIPID PANEL
Cholesterol: 137 mg/dL (ref <200)
HDL: 51 mg/dL (ref >=50)
LDL Cholesterol (Calc): 68 mg/dL (calc)
Non-HDL Cholesterol (Calc): 86 mg/dL (calc) (ref <130)
Total CHOL/HDL Ratio: 2.7 (calc) (ref <5.0)
Triglycerides: 98 mg/dL (ref <150)

## 2021-12-11 ENCOUNTER — Other Ambulatory Visit: Payer: Self-pay | Admitting: Internal Medicine

## 2021-12-11 DIAGNOSIS — F419 Anxiety disorder, unspecified: Secondary | ICD-10-CM

## 2021-12-11 NOTE — Telephone Encounter (Signed)
Requested medications are due for refill today.  no  Requested medications are on the active medications list.  yes  Last refill. 11/27/2021 #30 1 rf  Future visit scheduled.   yes  Notes to clinic.  Pt is requesting a 90 day supply.    Requested Prescriptions  Pending Prescriptions Disp Refills   escitalopram (LEXAPRO) 10 MG tablet [Pharmacy Med Name: ESCITALOPRAM 10 MG TABLET] 90 tablet 1    Sig: TAKE 1 TABLET BY MOUTH EVERY DAY     Psychiatry:  Antidepressants - SSRI Passed - 12/11/2021  8:40 AM      Passed - Completed PHQ-2 or PHQ-9 in the last 360 days      Passed - Valid encounter within last 6 months    Recent Outpatient Visits           2 weeks ago [place], DO   5 months ago [facility] Teodora Medici, DO   7 months ago [place], DO   3 years ago [place], MD   4 years ago Fever blister   [place], NP       Future Appointments             In 4 weeks Teodora Medici, [facility], [facility]

## 2021-12-12 NOTE — Telephone Encounter (Signed)
Ins wants 90 day  

## 2021-12-20 ENCOUNTER — Other Ambulatory Visit: Payer: Self-pay | Admitting: Internal Medicine

## 2021-12-20 DIAGNOSIS — F419 Anxiety disorder, unspecified: Secondary | ICD-10-CM

## 2021-12-20 NOTE — Telephone Encounter (Signed)
Requested by interface surescripts. Medication dose discontinued 11/27/21. Requested Prescriptions  Refused Prescriptions Disp Refills  . escitalopram (LEXAPRO) 5 MG tablet [Pharmacy Med Name: ESCITALOPRAM 5 MG TABLET] 30 tablet 0    Sig: TAKE 1 TABLET (5 MG TOTAL) BY MOUTH DAILY.     Psychiatry:  Antidepressants - SSRI Passed - 12/20/2021  3:23 PM      Passed - Completed PHQ-2 or PHQ-9 in the last 360 days      Passed - Valid encounter within last 6 months    Recent Outpatient Visits          3 weeks ago [facility] Teodora Medici, DO   6 months ago [facility] Teodora Medici, DO   7 months ago [place], DO   3 years ago [place], MD   4 years ago Fever blister   [place], NP      Future Appointments            In 2 weeks Teodora Medici, [facility], [facility]

## 2022-01-08 ENCOUNTER — Telehealth (INDEPENDENT_AMBULATORY_CARE_PROVIDER_SITE_OTHER): Payer: Medicaid Other | Admitting: Internal Medicine

## 2022-01-08 ENCOUNTER — Encounter: Payer: Self-pay | Admitting: Internal Medicine

## 2022-01-08 DIAGNOSIS — F419 Anxiety disorder, unspecified: Secondary | ICD-10-CM

## 2022-01-08 NOTE — Progress Notes (Signed)
Virtual Visit via Video Note  I connected with Jamie Dodson on 01/08/22 at  1:00 PM EDT by a video enabled telemedicine application and verified that I am speaking with the correct person using two identifiers.  Location: Patient: Home   Provider: [facility]   I discussed the limitations of evaluation and management by telemedicine and the availability of in person appointments. The patient expressed understanding and agreed to proceed.  History of Present Illness:  Patient is presenting via telemedicine for follow up on anxiety and medications.  Anxiety: -Mood status: Improved.  Is still having some difficulty sleeping, patient does have a 105-year-old child at home but does have a hard time falling asleep and staying asleep.  She is using melatonin which does help sometimes. -Current treatment: Lexapro 10 mg, Hydroxyzine PRN -Doing much better on medication, compliant and reporting no side effects Psychotherapy/counseling: no in the past Previous psychiatric medications: zoloft (made groggy) Depressed mood: sometimes Anxious mood: yes Significant weight loss or gain: no     01/08/2022   10:50 AM 11/27/2021    1:10 PM 06/19/2021    1:14 PM 05/08/2021    9:24 AM 05/08/2021    9:21 AM  Depression screen PHQ 2/9  Decreased Interest 0 1 1 1 1   Down, Depressed, Hopeless 0 1 0  1  PHQ - 2 Score 0 2 1 1 2   Altered sleeping 1 1 0  3  Tired, decreased energy 1 1 0  2  Change in appetite 1 1 0  1  Feeling bad or failure about yourself  0 0 0  0  Trouble concentrating 1 1 0  1  Moving slowly or fidgety/restless 0 0 0  3  Suicidal thoughts 0 0 0  3  PHQ-9 Score 4 6 1  15   Difficult doing work/chores Somewhat difficult Somewhat difficult Not difficult at all  Somewhat difficult    Observations/Objective:  General: well appearing, no acute distress ENT: conjunctiva normal appearing bilaterally  Skin: no rashes, cyanosis or abnormal bruising noted Neuro: Answers all questions  appropriately Psych: Normal mood   Assessment and Plan:  1. Anxiety: Patient is doing much better and is well controlled with the Lexapro 10 mg. After discussion, the patient is opting to stay at her current dose.  Continue to monitor.  Schedule virtual follow-up in 4 to 5 months for recheck when she will be due for refills.   Follow Up Instructions: Virtual in 4 months    I discussed the assessment and treatment plan with the patient. The patient was provided an opportunity to ask questions and all were answered. The patient agreed with the plan and demonstrated an understanding of the instructions.   The patient was advised to call back or seek an in-person evaluation if the symptoms worsen or if the condition fails to improve as anticipated.  I provided 5 minutes of non-face-to-face time during this encounter.   Teodora Medici, DO

## 2022-01-08 NOTE — Progress Notes (Deleted)
New Patient Office Visit  Subjective:  Patient ID: Jamie Dodson, female    DOB: 08-Sep-2000  Age: 21 y.o. MRN: [id number]  CC:  No chief complaint on file.   HPI Jamie Dodson presents for follow up.   Anxiety: -Mood status: worse - having difficulty sleeping. Has a 61 year old baby but patient having a hard time falling asleep - goes to bed around midnight, working on better sleep schedule but anxiety keeping her up at night.  -Current treatment: Lexapro 10 mg, Hydroxyzine PRN -Doing much better on medication, compliant and reporting no side effects Psychotherapy/counseling: no in the past Previous psychiatric medications: zoloft (made groggy) Depressed mood:  sometimes Anxious mood: yes Significant weight loss or gain: no     11/27/2021    1:10 PM 06/19/2021    1:14 PM 05/08/2021    9:24 AM 05/08/2021    9:21 AM 11/18/2019    8:51 AM  Depression screen PHQ 2/9  Decreased Interest 1 1 1 1  0  Down, Depressed, Hopeless 1 0  1 0  PHQ - 2 Score 2 1 1 2  0  Altered sleeping 1 0  3 2  Tired, decreased energy 1 0  2 1  Change in appetite 1 0  1 0  Feeling bad or failure about yourself  0 0  0 0  Trouble concentrating 1 0  1 1  Moving slowly or fidgety/restless 0 0  3 0  Suicidal thoughts 0 0  3 0  PHQ-9 Score 6 1  15 4   Difficult doing work/chores Somewhat difficult Not difficult at all  Somewhat difficult    Anemia: -Managed by Gynecology, had been on iron supplements when she was pregnant, not on anymore  -Recently had change in contraception by gynecology - had a copper IUD but was having pain so that was removed -Last hemoglobin 9/23 improved to 13.0   Cold Sores/HVS: -No outbreaks in the last 2 months about  -Valtrex PRN  Health Maintenance: -Blood work UTD   Past Medical History:  Diagnosis Date   Anemia affecting pregnancy in second trimester 09/22/2019   Anxiety    Hx anxiety/panic feelings-no med currently   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   ER note  03/08/2018   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   Ureteral reflux     Past Surgical History:  Procedure Laterality Date   kidney surgery     SKIN GRAFT     Hand    Family History  Problem Relation Age of Onset   Anxiety disorder Mother    Depression Mother    Diabetes Maternal Grandmother    Hypertension Maternal Grandmother    Hypertension Maternal Grandfather     Social History   Socioeconomic History   Marital status: Single    Spouse name: 03/10/2018   Number of children: 0   Years of education: 10   Highest education level: 10th grade  Occupational History   Not on file  Tobacco Use   Smoking status: Former   Smokeless tobacco: Never  03/10/2018 Use: Some days   Substances: Nicotine  Substance and Sexual Activity   Alcohol use: Yes    Comment: occasional   Drug use: Yes    Types: Marijuana    Comment: Occ. use before known pregnant   Sexual activity: Yes    Birth control/protection: Condom, Pill, Implant    Comment: Last O.C.'s-3 yrs. ago  Other Topics Concern   Not on file  Social History Narrative   Patient lives with her finance and reports a supportive relationship of 3 years. She reports having one best friend and having a close relationship with her parents. She is currently unemployed and going to school online to obtain her high school diploma.    Social Determinants of Health   Financial Resource Strain: Not on file  Food Insecurity: No Food Insecurity (05/26/2019)   Hunger Vital Sign    Worried About Running Out of Food in the Last Year: Never true    Ran Out of Food in the Last Year: Never true  Transportation Needs: No Transportation Needs (05/26/2019)   PRAPARE - Hydrologist (Medical): No    Lack of Transportation (Non-Medical): No  Physical Activity: Not on file  Stress: Not on file  Social Connections: Not on file  Intimate Partner Violence: Not At Risk (05/26/2019)   Humiliation, Afraid, Rape,  and Kick questionnaire    Fear of Current or Ex-Partner: No    Emotionally Abused: No    Physically Abused: No    Sexually Abused: No    ROS Review of Systems  Constitutional:  Negative for appetite change, chills and fever.  Respiratory:  Negative for cough.   Cardiovascular:  Negative for chest pain.  Gastrointestinal:  Negative for abdominal pain, nausea and vomiting.  Psychiatric/Behavioral:  Positive for sleep disturbance. The patient is nervous/anxious.     Objective:   Today's Vitals: There were no vitals taken for this visit.   Physical Exam Constitutional:      Appearance: Normal appearance.  HENT:     Head: Normocephalic and atraumatic.     Mouth/Throat:     Comments: One cold sore almost healed on left lower lip Eyes:     Conjunctiva/sclera: Conjunctivae normal.  Cardiovascular:     Rate and Rhythm: Normal rate and regular rhythm.  Pulmonary:     Effort: Pulmonary effort is normal.     Breath sounds: Normal breath sounds.  Musculoskeletal:     Right lower leg: No edema.     Left lower leg: No edema.  Skin:    General: Skin is warm and dry.  Neurological:     General: No focal deficit present.     Mental Status: She is alert. Mental status is at baseline.  Psychiatric:        Mood and Affect: Mood normal.        Behavior: Behavior normal.     Assessment & Plan:   1. Anxiety: Worse, having difficulty sleeping. Increase Lexapro to 10 mg daily. Recommend Melatonin 3 mg over the counter to help with sleep. Follow up in 6 weeks to recheck.   - COMPLETE METABOLIC PANEL WITH GFR - escitalopram (LEXAPRO) 10 MG tablet; Take 1 tablet (10 mg total) by mouth daily.  Dispense: 30 tablet; Refill: 1  2. Anemia, unspecified type: Had been on iron supplements when she was pregnant but not currently. Recheck CBC today.   - CBC w/Diff/Platelet  3. Screening for metabolic disorder: Check CMP today for screening.   - COMPLETE METABOLIC PANEL WITH GFR  4. Lipid  screening: Screening labs due.   - Lipid Profile  Follow-up: No follow-ups on file.   Teodora Medici, DO

## 2022-07-23 ENCOUNTER — Encounter: Payer: Self-pay | Admitting: Internal Medicine

## 2022-07-23 ENCOUNTER — Ambulatory Visit: Payer: Medicaid Other | Admitting: Internal Medicine

## 2022-07-23 VITALS — BP 108/64 | HR 86 | Temp 98.6°F | Resp 16 | Ht 64.75 in | Wt 125.0 lb

## 2022-07-23 DIAGNOSIS — M7989 Other specified soft tissue disorders: Secondary | ICD-10-CM | POA: Diagnosis not present

## 2022-07-23 NOTE — Progress Notes (Addendum)
   Acute Office Visit  Subjective:     Patient ID: Jamie Dodson, female    DOB: 2000/08/21, 22 y.o.   MRN: [id number]  Chief Complaint  Patient presents with   Cyst    On neck    HPI Patient is in today for lump on the left side of neck. First noticed when she woke up with it 2 days ago. It is generally not painful, sometimes a little tender with palpation. No itching or draining. No overlying skin changes. Never had anything like this before. Denies recent viral or bacterial illness, no recent vaccines. No other changes. No swelling or skin changes anywhere else. No fevers or weight changes.   Review of Systems  Constitutional:  Negative for chills, fever and weight loss.  Skin: Negative.         Objective:    BP 108/64   Pulse 86   Temp 98.6 F (37 C)   Resp 16   Ht 5' 4.75" (1.645 m)   Wt 125 lb (56.7 kg)   LMP 07/20/2022   SpO2 99%   BMI 20.96 kg/m  BP Readings from Last 3 Encounters:  07/23/22 108/64  11/27/21 114/74  06/19/21 116/72   Wt Readings from Last 3 Encounters:  07/23/22 125 lb (56.7 kg)  11/27/21 132 lb 1.6 oz (59.9 kg)  06/19/21 130 lb 12.8 oz (59.3 kg)      Physical Exam Constitutional:      Appearance: Normal appearance.  HENT:     Head: Normocephalic and atraumatic.  Eyes:     Conjunctiva/sclera: Conjunctivae normal.  Neck:     Comments: 2 cm soft, freely mobile soft tissue mass on the anterior left neck. Non-tender. No thyromegaly.  Cardiovascular:     Rate and Rhythm: Normal rate and regular rhythm.  Pulmonary:     Effort: Pulmonary effort is normal.     Breath sounds: Normal breath sounds.  Musculoskeletal:     Cervical back: Neck supple. No tenderness.  Skin:    General: Skin is warm and dry.  Neurological:     General: No focal deficit present.     Mental Status: She is alert. Mental status is at baseline.  Psychiatric:        Mood and Affect: Mood normal.        Behavior: Behavior normal.     No results found for any  visits on 07/23/22.      Assessment & Plan:   1. Mass of soft tissue of neck: Thinking this is a reactive lymph node but nothing in history to precipitate symptoms. Will obtain an [place] to confirm diagnosis. Discussed that with lymphadenopathy, anti-inflammatories can help decrease swelling and pain.   - US Soft Tissue Head/Neck (NON-THYROID); Future   Return if symptoms worsen or fail to improve.  Margarita Mail, DO

## 2022-07-25 ENCOUNTER — Ambulatory Visit
Admission: RE | Admit: 2022-07-25 | Discharge: 2022-07-25 | Disposition: A | Discharge status: Home / Self Care | Payer: Medicaid Other | Source: Ambulatory Visit | Attending: Internal Medicine | Admitting: Internal Medicine

## 2022-07-25 DIAGNOSIS — M7989 Other specified soft tissue disorders: Secondary | ICD-10-CM | POA: Diagnosis not present

## 2022-07-27 ENCOUNTER — Other Ambulatory Visit: Payer: Self-pay | Admitting: Internal Medicine

## 2022-07-27 DIAGNOSIS — F419 Anxiety disorder, unspecified: Secondary | ICD-10-CM

## 2022-07-27 NOTE — Telephone Encounter (Signed)
Requested Prescriptions  Pending Prescriptions Disp Refills   escitalopram (LEXAPRO) 10 MG tablet [Pharmacy Med Name: ESCITALOPRAM 10 MG TABLET] 90 tablet 1    Sig: TAKE 1 TABLET BY MOUTH EVERY DAY     Psychiatry:  Antidepressants - SSRI Passed - 07/27/2022 12:21 PM      Passed - Completed PHQ-2 or PHQ-9 in the last 360 days      Passed - Valid encounter within last 6 months    Recent Outpatient Visits           4 days ago Mass of soft tissue of neck   [facility] Margarita Mail, DO   6 months ago Anxiety   [facility] Margarita Mail, DO   8 months ago Anxiety   [facility] Margarita Mail, DO   1 year ago Anxiety   [facility] Margarita Mail, DO   1 year ago Anxiety   [facility] Margarita Mail, [place]

## 2022-10-19 ENCOUNTER — Other Ambulatory Visit: Payer: Self-pay | Admitting: Internal Medicine

## 2022-10-19 DIAGNOSIS — F419 Anxiety disorder, unspecified: Secondary | ICD-10-CM

## 2022-10-19 NOTE — Telephone Encounter (Signed)
Refused the Lexapro 5 mg because it was discontinued on 11/27/2021.

## 2023-01-01 ENCOUNTER — Other Ambulatory Visit: Payer: Self-pay | Admitting: Internal Medicine

## 2023-01-01 DIAGNOSIS — F419 Anxiety disorder, unspecified: Secondary | ICD-10-CM

## 2023-01-02 NOTE — Telephone Encounter (Signed)
Requested Prescriptions  Pending Prescriptions Disp Refills   escitalopram (LEXAPRO) 10 MG tablet [Pharmacy Med Name: ESCITALOPRAM 10 MG TABLET] 90 tablet 0    Sig: TAKE 1 TABLET BY MOUTH EVERY DAY     Psychiatry:  Antidepressants - SSRI Passed - 01/01/2023  2:05 PM      Passed - Completed PHQ-2 or PHQ-9 in the last 360 days      Passed - Valid encounter within last 6 months    Recent Outpatient Visits           5 months ago Mass of soft tissue of neck   [facility] Margarita Mail, DO   11 months ago Anxiety   [facility] Margarita Mail, DO   1 year ago Anxiety   [facility] Margarita Mail, DO   1 year ago Anxiety   [facility] Margarita Mail, DO   1 year ago Anxiety   [facility] Margarita Mail, [place]

## 2023-03-01 ENCOUNTER — Ambulatory Visit: Payer: Self-pay

## 2023-03-01 ENCOUNTER — Encounter: Payer: Self-pay | Admitting: Nurse Practitioner

## 2023-03-01 ENCOUNTER — Ambulatory Visit (INDEPENDENT_AMBULATORY_CARE_PROVIDER_SITE_OTHER): Payer: Medicaid Other | Admitting: Nurse Practitioner

## 2023-03-01 VITALS — BP 110/68 | HR 80 | Temp 97.7°F | Resp 12 | Ht 64.0 in | Wt 121.5 lb

## 2023-03-01 DIAGNOSIS — R112 Nausea with vomiting, unspecified: Secondary | ICD-10-CM | POA: Diagnosis not present

## 2023-03-01 DIAGNOSIS — F419 Anxiety disorder, unspecified: Secondary | ICD-10-CM | POA: Diagnosis not present

## 2023-03-01 DIAGNOSIS — R102 Pelvic and perineal pain: Secondary | ICD-10-CM | POA: Insufficient documentation

## 2023-03-01 DIAGNOSIS — F330 Major depressive disorder, recurrent, mild: Secondary | ICD-10-CM | POA: Diagnosis not present

## 2023-03-01 DIAGNOSIS — L723 Sebaceous cyst: Secondary | ICD-10-CM

## 2023-03-01 MED ORDER — ESCITALOPRAM OXALATE 20 MG PO TABS: 10.0000 mg | ORAL_TABLET | Freq: Every day | ORAL | 0 refills | Status: DC

## 2023-03-01 MED ORDER — ONDANSETRON 4 MG PO TBDP: 4.0000 mg | ORAL_TABLET | Freq: Three times a day (TID) | ORAL | 0 refills | Status: DC | PRN

## 2023-03-01 MED ORDER — PROMETHAZINE HCL 25 MG PO TABS: 25.0000 mg | ORAL_TABLET | Freq: Three times a day (TID) | ORAL | 0 refills | Status: DC | PRN

## 2023-03-01 NOTE — Telephone Encounter (Signed)
  Chief Complaint: Vomiting since 12 am this morning Symptoms: above Frequency: 12 am Pertinent Negatives: Patient denies fever, abdominal pain Disposition: [] ED /[] Urgent Care (no appt availability in office) / [x] Appointment(In office/virtual)/ []  [facility]/ [] Home Care/ [] Refused Recommended Disposition /[] [facility]/ []  Follow-up with PCP Additional Notes: Pt states that she has been vomiting all night  - more than 6 times. No one else in the household is sick. She is not able to keep water down. Pt would like to be seen in office today. She also has medication questions.     Summary: Vomitting Advice   Pt is calling to report that she has been vomitting last night. Please advise     Reason for Disposition  [1] SEVERE vomiting (e.g., 6 or more times/day) AND [2] present > 8 hours (Exception: Patient sounds well, is drinking liquids, does not sound dehydrated, and vomiting has lasted less than 24 hours.)  Answer Assessment - Initial Assessment Questions 1. VOMITING SEVERITY: "How many times have you vomited in the past 24 hours?"     - MILD:  1 - 2 times/day    - MODERATE: 3 - 5 times/day, decreased oral intake without significant weight loss or symptoms of dehydration    - SEVERE: 6 or more times/day, vomits everything or nearly everything, with significant weight loss, symptoms of dehydration      6 times 2. ONSET: "When did the vomiting begin?"      12 am this morning 3. FLUIDS: "What fluids or food have you vomited up today?" "Have you been able to keep any fluids down?"     no 4. ABDOMEN PAIN: "Are your having any abdomen pain?" If Yes : "How bad is it and what does it feel like?" (e.g., crampy, dull, intermittent, constant)      no 5. DIARRHEA: "Is there any diarrhea?" If Yes, ask: "How many times today?"      no 6. CONTACTS: "Is there anyone else in the family with the same symptoms?"      no 7. CAUSE: "What do you think is causing your  vomiting?"     No - chicken for dinner 9. OTHER SYMPTOMS: "Do you have any other symptoms?" (e.g., fever, headache, vertigo, vomiting blood or coffee grounds, recent head injury)     no  Protocols used: Vomiting-A-AH

## 2023-03-01 NOTE — Progress Notes (Signed)
BP 110/68 (BP Location: Right Arm, Patient Position: Sitting, Cuff Size: Normal)   Pulse 80   Temp 97.7 F (36.5 C) (Oral)   Resp 12   Ht 5\' 4"  (1.626 m)   Wt 121 lb 8 oz (55.1 kg)   LMP 02/28/2023 (Approximate)   SpO2 98%   BMI 20.86 kg/m    Subjective:    Patient ID: Jamie Dodson, female    DOB: [birth year], 22 y.o.   MRN: [id number]  HPI: Herman A Riquelme is a 22 y.o. female  Chief Complaint  Patient presents with   Emesis    xtoday    Discussed the use of AI scribe software for clinical note transcription with the patient, who gave verbal consent to proceed.  History of Present Illness   The patient, with a history of anxiety managed with escitalopram and hydroxyzine, presents with acute onset vomiting since 1 AM. The last episode of vomiting was at 8:30 AM. They report feeling queasy but are able to hold down food and water. They deny diarrhea, fever, and urinary symptoms. They describe abdominal cramping similar to an upset stomach but deny sharp pain.  They have not taken any medication for the nausea.  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 They have been on escitalopram since March and have noticed increased fatigue. They have been taking hydroxyzine less frequently and have an adequate supply. They also report a new bump on the back of their head that they noticed a few days ago. It was initially thought to be a pimple but has since grown larger. It is tender to touch.       03/01/2023   11:46 AM 07/23/2022    2:59 PM 01/08/2022   10:50 AM  Depression screen PHQ 2/9  Decreased Interest 0 0 0  Down, Depressed, Hopeless 0 0 0  PHQ - 2 Score 0 0 0  Altered sleeping 1 0 1  Tired, decreased energy 1 0 1  Change in appetite 0 0 1  Feeling bad or failure about yourself  0 0 0  Trouble concentrating 2 0 1  Moving slowly or fidgety/restless 2 0 0  Suicidal thoughts 0 0 0  PHQ-9 Score 6 0 4   Difficult doing work/chores Somewhat difficult Not difficult at all Somewhat difficult       03/01/2023   11:47 AM 01/08/2022   10:50 AM 11/27/2021    1:11 PM 05/08/2021    9:25 AM  GAD 7 : Generalized Anxiety Score  Nervous, Anxious, on Edge 2 1 1 3   Control/stop worrying 1 1 0 2  Worry too much - different things 1 1 1 2   Trouble relaxing 3 2 1 3   Restless 1 2 1 3   Easily annoyed or irritable 3 1 1 3   Afraid - awful might happen 0 0 0 1  Total GAD 7 Score 11 8 5 17   Anxiety Difficulty Somewhat difficult Somewhat difficult Somewhat difficult Somewhat difficult     Relevant past medical, surgical, family and social history reviewed and updated as indicated. Interim medical history since our last visit reviewed. Allergies and medications reviewed and updated.  Review of Systems  Ten systems reviewed and is negative except as mentioned in HPI      Objective:    BP 110/68 (BP Location: Right Arm, Patient Position: Sitting, Cuff Size: Normal)   Pulse 80   Temp 97.7 F (36.5  C) (Oral)   Resp 12   Ht 5\' 4"  (1.626 m)   Wt 121 lb 8 oz (55.1 kg)   LMP 02/28/2023 (Approximate)   SpO2 98%   BMI 20.86 kg/m    Wt Readings from Last 3 Encounters:  03/01/23 121 lb 8 oz (55.1 kg)  07/23/22 125 lb (56.7 kg)  11/27/21 132 lb 1.6 oz (59.9 kg)    Physical Exam  Constitutional: Patient appears well-developed and well-nourished.  No distress.  HEENT: head atraumatic, normocephalic, cyst on back of head about the size of a nickel, pupils equal and reactive to light, neck supple Cardiovascular: Normal rate, regular rhythm and normal heart sounds.  No murmur heard. No BLE edema. Pulmonary/Chest: Effort normal and breath sounds normal. No respiratory distress. Abdominal: Soft.  Generalized tenderness Psychiatric: Patient has a normal mood and affect. behavior is normal. Judgment and thought content normal.  Results for orders placed or performed in visit on 11/27/21  CBC w/Diff/Platelet    Collection Time: 11/27/21  1:54 PM  Result Value Ref Range   WBC 8.4 3.8 - 10.8 Thousand/uL   RBC 4.33 3.80 - 5.10 Million/uL   Hemoglobin 13.0 11.7 - 15.5 g/dL   HCT 16.1 09.6 - 04.5 %   MCV 90.3 80.0 - 100.0 fL   MCH 30.0 27.0 - 33.0 pg   MCHC 33.2 32.0 - 36.0 g/dL   RDW 40.9 81.1 - 91.4 %   Platelets 208 140 - 400 Thousand/uL   MPV 11.0 7.5 - 12.5 fL   Neutro Abs 4,847 1,500 - 7,800 cells/uL   Lymphs Abs 2,671 850 - 3,900 cells/uL   Absolute Monocytes 479 200 - 950 cells/uL   Eosinophils Absolute 370 15 - 500 cells/uL   Basophils Absolute 34 0 - 200 cells/uL   Neutrophils Relative % 57.7 %   Total Lymphocyte 31.8 %   Monocytes Relative 5.7 %   Eosinophils Relative 4.4 %   Basophils Relative 0.4 %  COMPLETE METABOLIC PANEL WITH GFR   Collection Time: 11/27/21  1:54 PM  Result Value Ref Range   Glucose, Bld 76 65 - 99 mg/dL   BUN 15 7 - 25 mg/dL   Creat 7.82 (H) 9.56 - 0.96 mg/dL   eGFR 82 > OR = 60 OZ/HYQ/6.57Q4   BUN/Creatinine Ratio 15 6 - 22 (calc)   Sodium 140 135 - 146 mmol/L   Potassium 4.2 3.5 - 5.3 mmol/L   Chloride 105 98 - 110 mmol/L   CO2 25 20 - 32 mmol/L   Calcium 9.4 8.6 - 10.2 mg/dL   Total Protein 7.4 6.1 - 8.1 g/dL   Albumin 4.8 3.6 - 5.1 g/dL   Globulin 2.6 1.9 - 3.7 g/dL (calc)   AG Ratio 1.8 1.0 - 2.5 (calc)   Total Bilirubin 0.4 0.2 - 1.2 mg/dL   Alkaline phosphatase (APISO) 39 31 - 125 U/L   AST 20 10 - 30 U/L   ALT 13 6 - 29 U/L  Lipid Profile   Collection Time: 11/27/21  1:54 PM  Result Value Ref Range   Cholesterol 137 <200 mg/dL   HDL 51 > OR = 50 mg/dL   Triglycerides 98 <696 mg/dL   LDL Cholesterol (Calc) 68 mg/dL (calc)   Total CHOL/HDL Ratio 2.7 <5.0 (calc)   Non-HDL Cholesterol (Calc) 86 <295 mg/dL (calc)       Assessment & Plan:   Problem List Items Addressed This Visit       Other   Anxiety  Relevant Medications   escitalopram (LEXAPRO) 20 MG tablet   Mild episode of recurrent major depressive disorder (HCC)    Relevant Medications   escitalopram (LEXAPRO) 20 MG tablet   Other Visit Diagnoses       Nausea and vomiting, unspecified vomiting type    -  Primary   Relevant Medications   ondansetron (ZOFRAN-ODT) 4 MG disintegrating tablet   promethazine (PHENERGAN) 25 MG tablet     Sebaceous cyst            Assessment and Plan    Acute Gastroenteritis Vomiting since 1 AM, last episode at 8:30 AM. No diarrhea. Mild abdominal cramping. No fever. No urinary symptoms. Likely viral gastroenteritis, less likely food poisoning. -Start Zofran for nausea as needed. -Start Phenergan for nausea as needed, with caution due to sedating effects. -Continue bland diet and slowly reintroduce regular diet as tolerated.  Anxiety/depression Increased stress due to new job position. Reports increased fatigue. -Increase Escitalopram (Lexapro) from 10mg  to 20mg  daily. -Follow up in 4 weeks to assess tolerance and efficacy of increased dose.  Sebaceous Cyst Newly noticed cyst on the back of the head. No associated symptoms. -Apply warm compresses 4 times daily. -Follow up via MyChart message in a few days to assess response to treatment.  General Health Maintenance / Followup Plans -Return to work tomorrow. -Follow up in 4 weeks to assess response to increased Escitalopram dose. -Update via MyChart message regarding sebaceous cyst.        Follow up plan: Return in about 4 weeks (around 03/29/2023) for follow up.

## 2023-03-29 ENCOUNTER — Telehealth (INDEPENDENT_AMBULATORY_CARE_PROVIDER_SITE_OTHER): Payer: BC Managed Care – PPO | Admitting: Internal Medicine

## 2023-03-29 ENCOUNTER — Other Ambulatory Visit: Payer: Self-pay

## 2023-03-29 ENCOUNTER — Encounter: Payer: Self-pay | Admitting: Internal Medicine

## 2023-03-29 DIAGNOSIS — F419 Anxiety disorder, unspecified: Secondary | ICD-10-CM

## 2023-03-29 MED ORDER — HYDROXYZINE HCL 10 MG PO TABS: 10.0000 mg | ORAL_TABLET | Freq: Three times a day (TID) | ORAL | 1 refills | Status: AC | PRN

## 2023-03-29 MED ORDER — ESCITALOPRAM OXALATE 20 MG PO TABS: 20.0000 mg | ORAL_TABLET | Freq: Every day | ORAL | 1 refills | Status: DC

## 2023-03-29 NOTE — Progress Notes (Addendum)
 Virtual Visit via Video Note  I connected with Jamie Dodson on 03/29/23 at  1:00 PM EST by a video enabled telemedicine application and verified that I am speaking with the correct person using two identifiers.  Location: Patient: Home Provider: Home   I discussed the limitations of evaluation and management by telemedicine and the availability of in person appointments. The patient expressed understanding and agreed to proceed.  History of Present Illness:  The patient is following up on chronic medical conditions and medications via telemedicine due to weather today.  MDD: -Mood status: controlled -Current treatment: Lexapro  20 mg (increased last month), Hydroxyzine  PRN -Satisfied with current treatment?: yes -Duration of current treatment :  1 month -Side effects: no Medication compliance: excellent compliance     03/01/2023   11:46 AM 07/23/2022    2:59 PM 01/08/2022   10:50 AM 11/27/2021    1:10 PM 06/19/2021    1:14 PM  Depression screen PHQ 2/9  Decreased Interest 0 0 0 1 1  Down, Depressed, Hopeless 0 0 0 1 0  PHQ - 2 Score 0 0 0 2 1  Altered sleeping 1 0 1 1 0  Tired, decreased energy 1 0 1 1 0  Change in appetite 0 0 1 1 0  Feeling bad or failure about yourself  0 0 0 0 0  Trouble concentrating 2 0 1 1 0  Moving slowly or fidgety/restless 2 0 0 0 0  Suicidal thoughts 0 0 0 0 0  PHQ-9 Score 6 0 4 6 1   Difficult doing work/chores Somewhat difficult Not difficult at all Somewhat difficult Somewhat difficult Not difficult at all   Patient also endorses ongoing episodes of nausea and vomiting, last had a few weeks ago. Cramping abdominal pain but will resolved after a couple days with Zofran  and Phenegran. Does not have symptoms now. Occasional acid reflux symptoms but this is independent of nausea.   Health Maintenance: -Blood work due - plan for fasting labs at follow up -Pap 2022, negative   Observations/Objective:  General: well appearing, no acute  distress ENT: conjunctiva normal appearing bilaterally  Skin: no rashes, cyanosis or abnormal bruising noted Neuro: answers all questions appropriately   Assessment and Plan:  1. Anxiety (Primary): Stable, doing well with Lexapro  dose change, will refill along with Hydroxyzine . I do not think this nausea is from the Lexapro  as that occurs in episodes, will continue to monitor and obtain labs at follow up.   - escitalopram  (LEXAPRO ) 20 MG tablet; Take 1 tablet (20 mg total) by mouth daily.  Dispense: 90 tablet; Refill: 1 - hydrOXYzine  (ATARAX ) 10 MG tablet; Take 1 tablet (10 mg total) by mouth 3 (three) times daily as needed for anxiety.  Dispense: 30 tablet; Refill: 1   Follow Up Instructions: 6 months     I discussed the assessment and treatment plan with the patient. The patient was provided an opportunity to ask questions and all were answered. The patient agreed with the plan and demonstrated an understanding of the instructions.   The patient was advised to call back or seek an in-person evaluation if the symptoms worsen or if the condition fails to improve as anticipated.  I provided 8 minutes of non-face-to-face time during this encounter.   Sharyle Fischer, DO

## 2023-04-01 ENCOUNTER — Telehealth: Payer: Self-pay

## 2023-04-01 NOTE — Telephone Encounter (Signed)
-----   Message from Margarita Mail sent at 03/29/2023  2:32 PM EST ----- Regarding: Follow up Please schedule this patient a regular medical follow up in 6 months in person thank you.

## 2023-04-01 NOTE — Telephone Encounter (Signed)
 Lvm for pt to call back and schedule 6 mth fu

## 2023-04-16 ENCOUNTER — Other Ambulatory Visit: Payer: Self-pay

## 2023-04-16 ENCOUNTER — Emergency Department
Admission: EM | Admit: 2023-04-16 | Discharge: 2023-04-16 | Disposition: A | Discharge status: Home / Self Care | Payer: Medicaid Other | Attending: Emergency Medicine | Admitting: Emergency Medicine

## 2023-04-16 DIAGNOSIS — Z20822 Contact with and (suspected) exposure to covid-19: Secondary | ICD-10-CM | POA: Diagnosis not present

## 2023-04-16 DIAGNOSIS — R112 Nausea with vomiting, unspecified: Secondary | ICD-10-CM | POA: Diagnosis not present

## 2023-04-16 DIAGNOSIS — I959 Hypotension, unspecified: Secondary | ICD-10-CM | POA: Diagnosis not present

## 2023-04-16 DIAGNOSIS — R059 Cough, unspecified: Secondary | ICD-10-CM | POA: Insufficient documentation

## 2023-04-16 DIAGNOSIS — K529 Noninfective gastroenteritis and colitis, unspecified: Secondary | ICD-10-CM | POA: Insufficient documentation

## 2023-04-16 DIAGNOSIS — R197 Diarrhea, unspecified: Secondary | ICD-10-CM | POA: Diagnosis not present

## 2023-04-16 DIAGNOSIS — R1084 Generalized abdominal pain: Secondary | ICD-10-CM | POA: Diagnosis not present

## 2023-04-16 LAB — CBC
HCT: 39.3 % (ref 36.0–46.0)
Hemoglobin: 13.1 g/dL (ref 12.0–15.0)
MCH: 28.7 pg (ref 26.0–34.0)
MCHC: 33.3 g/dL (ref 30.0–36.0)
MCV: 86.2 fL (ref 80.0–100.0)
Platelets: 189 10*3/uL (ref 150–400)
RBC: 4.56 MIL/uL (ref 3.87–5.11)
RDW: 12.6 % (ref 11.5–15.5)
WBC: 15.8 10*3/uL — ABNORMAL HIGH (ref 4.0–10.5)
nRBC: 0 % (ref 0.0–0.2)

## 2023-04-16 LAB — URINALYSIS, ROUTINE W REFLEX MICROSCOPIC
Bilirubin Urine: NEGATIVE
Glucose, UA: NEGATIVE mg/dL
Hgb urine dipstick: NEGATIVE
Ketones, ur: NEGATIVE mg/dL
Nitrite: NEGATIVE
Protein, ur: 30 mg/dL — AB
Specific Gravity, Urine: 1.031 — ABNORMAL HIGH (ref 1.005–1.030)
pH: 5 (ref 5.0–8.0)

## 2023-04-16 LAB — RESP PANEL BY RT-PCR (RSV, FLU A&B, COVID)  RVPGX2
Influenza A by PCR: NEGATIVE
Influenza B by PCR: NEGATIVE
Resp Syncytial Virus by PCR: NEGATIVE
SARS Coronavirus 2 by RT PCR: NEGATIVE

## 2023-04-16 LAB — COMPREHENSIVE METABOLIC PANEL
ALT: 19 U/L (ref 0–44)
AST: 29 U/L (ref 15–41)
Albumin: 4.6 g/dL (ref 3.5–5.0)
Alkaline Phosphatase: 32 U/L — ABNORMAL LOW (ref 38–126)
Anion gap: 12 (ref 5–15)
BUN: 23 mg/dL — ABNORMAL HIGH (ref 6–20)
CO2: 20 mmol/L — ABNORMAL LOW (ref 22–32)
Calcium: 9.2 mg/dL (ref 8.9–10.3)
Chloride: 104 mmol/L (ref 98–111)
Creatinine, Ser: 0.81 mg/dL (ref 0.44–1.00)
GFR, Estimated: >60 mL/min (ref >60)
Glucose, Bld: 155 mg/dL — ABNORMAL HIGH (ref 70–99)
Potassium: 3.9 mmol/L (ref 3.5–5.1)
Sodium: 136 mmol/L (ref 135–145)
Total Bilirubin: 0.6 mg/dL (ref 0.0–1.2)
Total Protein: 8.3 g/dL — ABNORMAL HIGH (ref 6.5–8.1)

## 2023-04-16 LAB — LIPASE, BLOOD: Lipase: 31 U/L (ref 11–51)

## 2023-04-16 LAB — POC URINE PREG, ED: Preg Test, Ur: NEGATIVE

## 2023-04-16 MED ORDER — ONDANSETRON 4 MG PO TBDP: 4.0000 mg | ORAL_TABLET | Freq: Three times a day (TID) | ORAL | 0 refills | Status: AC | PRN

## 2023-04-16 MED ORDER — ONDANSETRON HCL 4 MG/2ML IJ SOLN
4.0000 mg | Freq: Once | INTRAMUSCULAR | Status: AC
  Administered 2023-04-16: 4 mg via INTRAVENOUS
  Filled 2023-04-16: qty 2

## 2023-04-16 MED ORDER — SODIUM CHLORIDE 0.9 % IV SOLN
12.5000 mg | Freq: Once | INTRAVENOUS | Status: DC
  Filled 2023-04-16: qty 0.5

## 2023-04-16 MED ORDER — LACTATED RINGERS IV BOLUS
1000.0000 mL | Freq: Once | INTRAVENOUS | Status: AC
  Administered 2023-04-16: 1000 mL via INTRAVENOUS

## 2023-04-16 NOTE — ED Provider Notes (Signed)
[facility] Provider Note    Event Date/Time   First MD Initiated Contact with Patient 04/16/23 [phone]     (approximate)   History   Chief Complaint Abdominal Pain, Emesis (Diarrhea/), and Diarrhea   HPI  Jamie Dodson is a 23 y.o. female with past medical history of anemia and anxiety who presents to the ED complaining of abdominal pain.  Patient reports that she woke up this morning around 3 AM with crampy pain in her upper abdomen as well as nausea, vomiting, and diarrhea.  She reports numerous episodes of vomiting and diarrhea, has been unable to keep anything down since the onset of symptoms.  She denies any blood in her emesis or stool and has not had any fevers.  She does report a slight cough recently but has not had any pain in her chest or difficulty breathing.  She reports her fianc and daughter have been sick with similar symptoms.     Physical Exam   Triage Vital Signs: ED Triage Vitals  Encounter Vitals Group     BP 04/16/23 0803 115/73     Systolic BP Percentile --      Diastolic BP Percentile --      Pulse Rate 04/16/23 0803 85     Resp 04/16/23 0803 16     Temp 04/16/23 0803 97.7 F (36.5 C)     Temp Source 04/16/23 0803 Oral     SpO2 04/16/23 0803 100 %     Weight 04/16/23 0805 128 lb (58.1 kg)     Height 04/16/23 0805 5\' 4"  (1.626 m)     Head Circumference --      Peak Flow --      Pain Score 04/16/23 0805 4     Pain Loc --      Pain Education --      Exclude from Growth Chart --     Most recent vital signs: Vitals:   04/16/23 0803  BP: 115/73  Pulse: 85  Resp: 16  Temp: 97.7 F (36.5 C)  SpO2: 100%    Constitutional: Alert and oriented. Eyes: Conjunctivae are normal. Head: Atraumatic. Nose: No congestion/rhinnorhea. Mouth/Throat: Mucous membranes are dry. Cardiovascular: Normal rate, regular rhythm. Grossly normal heart sounds.  2+ radial pulses bilaterally. Respiratory: Normal respiratory effort.  No  retractions. Lungs CTAB. Gastrointestinal: Soft and nontender. No distention. Musculoskeletal: No lower extremity tenderness nor edema.  Neurologic:  Normal speech and language. No gross focal neurologic deficits are appreciated.    ED Results / Procedures / Treatments   Labs (all labs ordered are listed, but only abnormal results are displayed) Labs Reviewed  COMPREHENSIVE METABOLIC PANEL - Abnormal; Notable for the following components:      Result Value   CO2 20 (*)    Glucose, Bld 155 (*)    BUN 23 (*)    Total Protein 8.3 (*)    Alkaline Phosphatase 32 (*)    All other components within normal limits  CBC - Abnormal; Notable for the following components:   WBC 15.8 (*)    All other components within normal limits  URINALYSIS, ROUTINE W REFLEX MICROSCOPIC - Abnormal; Notable for the following components:   Color, Urine YELLOW (*)    APPearance TURBID (*)    Specific Gravity, Urine 1.031 (*)    Protein, ur 30 (*)    Leukocytes,Ua SMALL (*)    Bacteria, UA RARE (*)    All other components within normal limits  RESP PANEL BY RT-PCR (RSV, FLU A&B, COVID)  RVPGX2  LIPASE, BLOOD  POC URINE PREG, ED    PROCEDURES:  Critical Care performed: No  Procedures   MEDICATIONS ORDERED IN ED: Medications  lactated ringers bolus 1,000 mL (1,000 mLs Intravenous New Bag/Given 04/16/23 1041)  ondansetron (ZOFRAN) injection 4 mg (4 mg Intravenous Given 04/16/23 1043)     IMPRESSION / MDM / ASSESSMENT AND PLAN / ED COURSE  I reviewed the triage vital signs and the nursing notes.                              23 y.o. female with past medical history of anemia and anxiety who presents to the ED complaining of abdominal pain, nausea, vomiting, and diarrhea since this morning.  Patient's presentation is most consistent with acute presentation with potential threat to life or bodily function.  Differential diagnosis includes, but is not limited to, gastroenteritis, pancreatitis,  hepatitis, gastritis, dehydration, electrolyte abnormality, AKI.  Patient nontoxic-appearing and in no acute distress, vital signs are unremarkable.  Her abdominal exam is benign and symptoms seem most consistent with a gastroenteritis.  She does appear clinically dehydrated, labs show leukocytosis with no significant anemia, electrolyte abnormality, or AKI.  LFTs and lipase are unremarkable, pregnancy testing is negative and urinalysis shows no signs of infection.  We will treat with IV Zofran and hydrate with IV fluids, reassess.  Patient feeling better following IV Zofran and fluids, now tolerating oral intake without difficulty.  She is appropriate for discharge home with outpatient follow-up, was counseled to return to the ED for new or worsening symptoms.  Patient agrees with plan.      FINAL CLINICAL IMPRESSION(S) / ED DIAGNOSES   Final diagnoses:  Gastroenteritis     Rx / DC Orders   ED Discharge Orders          Ordered    ondansetron (ZOFRAN-ODT) 4 MG disintegrating tablet  Every 8 hours PRN        04/16/23 1115             Note:  This document was prepared using Dragon voice recognition software and may include unintentional dictation errors.   Chesley Noon, MD 04/16/23 1116

## 2023-04-16 NOTE — ED Triage Notes (Signed)
Arrived by EMS from home c/o N/V/D with abdominal cramps for three hours. Reports family has been sick with same.   4mg  zofran administered by medic  EMS vitals: 133/60b/p 70HR 100RA 162 CBG

## 2023-04-26 DIAGNOSIS — J019 Acute sinusitis, unspecified: Secondary | ICD-10-CM | POA: Diagnosis not present

## 2023-04-26 DIAGNOSIS — B9689 Other specified bacterial agents as the cause of diseases classified elsewhere: Secondary | ICD-10-CM | POA: Diagnosis not present

## 2023-04-26 DIAGNOSIS — J209 Acute bronchitis, unspecified: Secondary | ICD-10-CM | POA: Diagnosis not present

## 2023-04-26 DIAGNOSIS — Z03818 Encounter for observation for suspected exposure to other biological agents ruled out: Secondary | ICD-10-CM | POA: Diagnosis not present

## 2023-05-29 ENCOUNTER — Other Ambulatory Visit: Payer: Self-pay | Admitting: Nurse Practitioner

## 2023-05-29 DIAGNOSIS — F419 Anxiety disorder, unspecified: Secondary | ICD-10-CM

## 2023-05-30 NOTE — Telephone Encounter (Signed)
 Requested medication (s) are due for refill today:   Requested medication (s) are on the active medication list: Different from medication list  Last refill:  03/29/23  Future visit scheduled: Yes  Notes to clinic:  See request.    Requested Prescriptions  Pending Prescriptions Disp Refills   escitalopram (LEXAPRO) 20 MG tablet [Pharmacy Med Name: ESCITALOPRAM 20 MG TABLET] 30 tablet     Sig: TAKE 1/2 TABLET BY MOUTH DAILY     Psychiatry:  Antidepressants - SSRI Passed - 05/30/2023  8:02 AM      Passed - Completed PHQ-2 or PHQ-9 in the last 360 days      Passed - Valid encounter within last 6 months    Recent Outpatient Visits           2 months ago Anxiety   [facility] Margarita Mail, DO   3 months ago Nausea and vomiting, unspecified vomiting type   [facility] Berniece Salines, FNP   10 months ago Mass of soft tissue of neck   [facility] Margarita Mail, DO   1 year ago Anxiety   [facility] Margarita Mail, DO   1 year ago Anxiety   [facility] Margarita Mail, DO       Future Appointments             In 3 months Margarita Mail, DO [facility]

## 2023-09-23 ENCOUNTER — Ambulatory Visit: Payer: Self-pay | Admitting: Internal Medicine

## 2023-11-14 ENCOUNTER — Ambulatory Visit: Payer: Self-pay

## 2023-11-14 ENCOUNTER — Encounter: Payer: Self-pay | Admitting: Internal Medicine

## 2023-11-14 ENCOUNTER — Ambulatory Visit (INDEPENDENT_AMBULATORY_CARE_PROVIDER_SITE_OTHER): Admitting: Internal Medicine

## 2023-11-14 VITALS — BP 110/70 | HR 90 | Temp 98.0°F | Resp 18 | Ht 64.0 in | Wt 124.3 lb

## 2023-11-14 DIAGNOSIS — F419 Anxiety disorder, unspecified: Secondary | ICD-10-CM

## 2023-11-14 DIAGNOSIS — G44219 Episodic tension-type headache, not intractable: Secondary | ICD-10-CM | POA: Diagnosis not present

## 2023-11-14 DIAGNOSIS — G245 Blepharospasm: Secondary | ICD-10-CM | POA: Diagnosis not present

## 2023-11-14 MED ORDER — ESCITALOPRAM OXALATE 20 MG PO TABS: 10.0000 mg | ORAL_TABLET | Freq: Every day | ORAL | 1 refills | Status: AC

## 2023-11-14 NOTE — Progress Notes (Signed)
 Acute Office Visit  Subjective:     Patient ID: Jamie Dodson, female    DOB: January 23, 2001, 23 y.o.   MRN: [id number]  Chief Complaint  Patient presents with   Eye Problem    Left eye twitching for 2 weeks   Headache    Eye Problem   Headache    Patient is in today for headache and left eye twitching. She is here with her mom today.   Discussed the use of AI scribe software for clinical note transcription with the patient, who gave verbal consent to proceed.  History of Present Illness Jamie Dodson is a 23 year old female who presents with headaches and eye twitching.  Headaches have been present for the past couple of weeks, developing as the day progresses and not present upon waking. Pain is localized to the corner of her head and is temporarily alleviated by neck movement. Tylenol  provides relief.  Twitching of the bottom lid of the left eye occurs frequently and is more noticeable with certain eye movements. There are no vision changes or pain with eye movement. Slight congestion is present, but there are no recent viral illnesses.  She is taking hydroxyzine  and escitalopram , with good tolerance. Hydroxyzine  use has been infrequent. She has high anxiety and a history of hospitalization for anxiety.  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 She stays hydrated and replaces electrolytes. She has been sleeping well recently, getting about eight hours of sleep.   Review of Systems  Neurological:  Positive for headaches.        Objective:    BP 110/70 (Cuff Size: Normal)   Pulse 90   Temp 98 F (36.7 C) (Oral)   Resp 18   Ht 5' 4 (1.626 m)   Wt 124 lb 4.8 oz (56.4 kg)   LMP 11/13/2023   SpO2 98%   BMI 21.34 kg/m  BP Readings from Last 3 Encounters:  11/14/23 110/70  04/16/23 115/73  03/01/23 110/68   Wt Readings from Last 3 Encounters:  11/14/23 124 lb 4.8 oz (56.4 kg)  04/16/23 128 lb (58.1 kg)  03/01/23 121  lb 8 oz (55.1 kg)      Physical Exam Constitutional:      Appearance: Normal appearance.  HENT:     Head: Normocephalic and atraumatic.  Eyes:     Extraocular Movements: Extraocular movements intact.     Conjunctiva/sclera: Conjunctivae normal.     Pupils: Pupils are equal, round, and reactive to light.  Cardiovascular:     Rate and Rhythm: Normal rate and regular rhythm.  Pulmonary:     Effort: Pulmonary effort is normal.     Breath sounds: Normal breath sounds.  Skin:    General: Skin is warm and dry.  Neurological:     General: No focal deficit present.     Mental Status: She is alert. Mental status is at baseline.     Cranial Nerves: No cranial nerve deficit.     Sensory: No sensory deficit.     Motor: No weakness.  Psychiatric:        Mood and Affect: Mood normal.        Behavior: Behavior normal.     No results found for any visits on 11/14/23.      Assessment & Plan:   Assessment & Plan Tension-type headache Headaches likely due to stress or musculoskeletal strain. No migraine or cluster headache signs. No neurological deficits. -  Recommend 600 mg Aleve to break the headache cycle. - Advise hydration and adequate rest.  Left eyelid myokymia Intermittent twitching likely related to stress and fatigue. No vision changes or pain. No neurological deficits. - Advise stress reduction and adequate rest. - Consider ophthalmology referral if symptoms worsen.  Depression and anxiety Managed with Lexapro  and hydroxyzine . Lexapro  safe during pregnancy; hydroxyzine  should be avoided early in pregnancy. - Refill Lexapro  for six months. - Discuss potential medication adjustments if planning pregnancy.  - escitalopram  (LEXAPRO ) 20 MG tablet; Take 0.5 tablets (10 mg total) by mouth daily.  Dispense: 90 tablet; Refill: 1   Return in about 6 months (around 05/16/2024).  Sharyle Fischer, DO

## 2023-11-14 NOTE — Telephone Encounter (Signed)
 FYI Only or Action Required?: FYI only for provider.  Patient was last seen in primary care on 03/29/2023 by Bernardo Fend, DO.  Called Nurse Triage reporting Headache.  Symptoms began several days ago.  Interventions attempted: Nothing.  Symptoms are: gradually worsening.  Triage Disposition: See PCP When Office is Open (Within 3 Days)  Patient/caregiver understands and will follow disposition?: Yes  Copied from CRM #[id number]. Topic: Clinical - Red Word Triage >> Nov 14, 2023 12:19 PM Willma R wrote: [facility] that prompted transfer to Nurse Triage: Patient states over the last couple of weeks her left eye has been twitching every other minute and then a few days ago she started getting really bad headaches. Reason for Disposition  [1] MILD-MODERATE headache AND [2] present > 3 days (72 hours) AND [3] no improvement after using Care Advice  Answer Assessment - Initial Assessment Questions 1. LOCATION: Where does it hurt?      Front of head on the top 2. ONSET: When did the headache start? (e.g., minutes, hours, days)      Two days ago 3. PATTERN: Does the pain come and go, or has it been constant since it started?     Constant today, but previously comes and goes 4. SEVERITY: How bad is the pain? and What does it keep you from doing?  (e.g., Scale 1-10; mild, moderate, or severe)     5/10 5. RECURRENT SYMPTOM: Have you ever had headaches before? If Yes, ask: When was the last time? and What happened that time?      Headaches yes, but not like this  6. CAUSE: What do you think is causing the headache?     unknown 7. MIGRAINE: Have you been diagnosed with migraine headaches? If Yes, ask: Is this headache similar?      denies 8. HEAD INJURY: Has there been any recent injury to your head?      denies 9. OTHER SYMPTOMS: Do you have any other symptoms? (e.g., fever, stiff neck, eye pain, sore throat, cold symptoms)     Eye twitching for a couple of  weeks 10. PREGNANCY: Is there any chance you are pregnant? When was your last menstrual period?       11/11/2023  Protocols used: [facility]

## 2024-05-15 ENCOUNTER — Ambulatory Visit: Admitting: Internal Medicine
